# Patient Record
Sex: Female | Born: 1985 | Race: White | Hispanic: No | Marital: Married | Smoking: Current every day smoker
Health system: Southern US, Community
[De-identification: ages and names within clinical notes are randomized; demographics above are authoritative.]

## PROBLEM LIST (undated history)

## (undated) ENCOUNTER — Inpatient Hospital Stay (HOSPITAL_COMMUNITY): Payer: Self-pay

## (undated) ENCOUNTER — Inpatient Hospital Stay (HOSPITAL_COMMUNITY): Admission: RE | Payer: Self-pay | Source: Ambulatory Visit

## (undated) DIAGNOSIS — F1910 Other psychoactive substance abuse, uncomplicated: Secondary | ICD-10-CM

## (undated) DIAGNOSIS — F419 Anxiety disorder, unspecified: Secondary | ICD-10-CM

## (undated) DIAGNOSIS — F99 Mental disorder, not otherwise specified: Secondary | ICD-10-CM

## (undated) DIAGNOSIS — F32A Depression, unspecified: Secondary | ICD-10-CM

## (undated) DIAGNOSIS — Z789 Other specified health status: Secondary | ICD-10-CM

## (undated) DIAGNOSIS — F329 Major depressive disorder, single episode, unspecified: Secondary | ICD-10-CM

## (undated) HISTORY — PX: NO PAST SURGERIES: SHX2092

---

## 2012-07-17 ENCOUNTER — Emergency Department (HOSPITAL_COMMUNITY)
Admission: EM | Admit: 2012-07-17 | Discharge: 2012-07-17 | Disposition: A | Discharge status: Home / Self Care | Payer: Self-pay | Attending: Emergency Medicine | Admitting: Emergency Medicine

## 2012-07-17 ENCOUNTER — Encounter (HOSPITAL_COMMUNITY): Payer: Self-pay | Admitting: Emergency Medicine

## 2012-07-17 DIAGNOSIS — F1210 Cannabis abuse, uncomplicated: Secondary | ICD-10-CM | POA: Insufficient documentation

## 2012-07-17 DIAGNOSIS — N760 Acute vaginitis: Secondary | ICD-10-CM | POA: Insufficient documentation

## 2012-07-17 DIAGNOSIS — B9689 Other specified bacterial agents as the cause of diseases classified elsewhere: Secondary | ICD-10-CM

## 2012-07-17 DIAGNOSIS — Z3201 Encounter for pregnancy test, result positive: Secondary | ICD-10-CM | POA: Insufficient documentation

## 2012-07-17 DIAGNOSIS — R109 Unspecified abdominal pain: Secondary | ICD-10-CM | POA: Insufficient documentation

## 2012-07-17 DIAGNOSIS — F17200 Nicotine dependence, unspecified, uncomplicated: Secondary | ICD-10-CM | POA: Insufficient documentation

## 2012-07-17 DIAGNOSIS — O2390 Unspecified genitourinary tract infection in pregnancy, unspecified trimester: Secondary | ICD-10-CM | POA: Insufficient documentation

## 2012-07-17 DIAGNOSIS — R112 Nausea with vomiting, unspecified: Secondary | ICD-10-CM | POA: Insufficient documentation

## 2012-07-17 DIAGNOSIS — N390 Urinary tract infection, site not specified: Secondary | ICD-10-CM | POA: Insufficient documentation

## 2012-07-17 DIAGNOSIS — Z3490 Encounter for supervision of normal pregnancy, unspecified, unspecified trimester: Secondary | ICD-10-CM

## 2012-07-17 LAB — URINALYSIS, ROUTINE W REFLEX MICROSCOPIC
Bilirubin Urine: NEGATIVE
Ketones, ur: 15 mg/dL — AB
Nitrite: POSITIVE — AB
Protein, ur: NEGATIVE mg/dL
pH: 6 (ref 5.0–8.0)

## 2012-07-17 LAB — CBC WITH DIFFERENTIAL/PLATELET
Basophils Absolute: 0 10*3/uL (ref 0.0–0.1)
Basophils Relative: 0 % (ref 0–1)
Eosinophils Absolute: 0.3 10*3/uL (ref 0.0–0.7)
Hemoglobin: 13.9 g/dL (ref 12.0–15.0)
MCH: 31.1 pg (ref 26.0–34.0)
MCHC: 34.8 g/dL (ref 30.0–36.0)
Monocytes Absolute: 0.6 10*3/uL (ref 0.1–1.0)
Monocytes Relative: 8 % (ref 3–12)
Neutro Abs: 5.7 10*3/uL (ref 1.7–7.7)
Neutrophils Relative %: 72 % (ref 43–77)
RDW: 14.3 % (ref 11.5–15.5)

## 2012-07-17 LAB — WET PREP, GENITAL

## 2012-07-17 LAB — BASIC METABOLIC PANEL
BUN: 10 mg/dL (ref 6–23)
Creatinine, Ser: 0.66 mg/dL (ref 0.50–1.10)
GFR calc Af Amer: >90 mL/min (ref >90)
GFR calc non Af Amer: >90 mL/min (ref >90)
Potassium: 3.7 mEq/L (ref 3.5–5.1)

## 2012-07-17 LAB — HCG, QUANTITATIVE, PREGNANCY: hCG, Beta Chain, Quant, S: 40982 m[IU]/mL — ABNORMAL HIGH (ref <5)

## 2012-07-17 MED ORDER — METRONIDAZOLE 500 MG PO TABS: 500.0000 mg | ORAL_TABLET | Freq: Two times a day (BID) | ORAL | Status: DC

## 2012-07-17 MED ORDER — CONCEPT OB 130-92.4-1 MG PO CAPS: 1.0000 | ORAL_CAPSULE | Freq: Every day | ORAL | Status: DC

## 2012-07-17 MED ORDER — NITROFURANTOIN MONOHYD MACRO 100 MG PO CAPS: 100.0000 mg | ORAL_CAPSULE | Freq: Two times a day (BID) | ORAL | Status: DC

## 2012-07-17 NOTE — ED Provider Notes (Signed)
History     CSN: [id number]  Arrival date & time 07/17/12  1421   First MD Initiated Contact with Patient 07/17/12 1505      Chief Complaint  Patient presents with  . Abdominal Pain    (Consider location/radiation/quality/duration/timing/severity/associated sxs/prior treatment) The history is provided by the patient and medical records.    Gabrielle Baker is a 26 y.o. female  with a hx of multiple miscarriages presents to the Emergency Department complaining of gradual, persistent, progressively worsening lower abdominal cramping onset 9am this morning.  Pt states pain is waxing and waning but never resolving.  Denies vaginal bleeding, mild clear vaginal discharge.  Pt states she is pregnant, but does not know how far along as she does not know her LMP.  She has irregular periods and has been on the [place] Shot, last one in Sept.  G5P1030.  Pt with smoking Hx of cigarettes and mariajuana.  Associated symptoms include nausea, vomiting.  Nothing makes it better and nothing makes it worse.  Pt denies fever, chills, chest pain, shortness of breathing, diarrhea, constipation, dizziness, weakness, lightheadedness, dysuria, hematuria.  Pt initially denies ectopic pregnancy, but later states her last miscarriage was "in my tubes but went away on its own."   History reviewed. No pertinent past medical history.  History reviewed. No pertinent past surgical history.  History reviewed. No pertinent family history.  History  Substance Use Topics  . Smoking status: Current Every Day Smoker  . Smokeless tobacco: Not on file  . Alcohol Use: No    OB History    Grav Para Term Preterm Abortions TAB SAB Ect Mult Living                  Review of Systems  Constitutional: Negative for fever, diaphoresis, appetite change, fatigue and unexpected weight change.  HENT: Negative for mouth sores and neck stiffness.   Eyes: Negative for visual disturbance.  Respiratory: Negative for cough, chest  tightness, shortness of breath and wheezing.   Cardiovascular: Negative for chest pain.  Gastrointestinal: Positive for nausea, vomiting and abdominal pain. Negative for diarrhea and constipation.  Genitourinary: Negative for dysuria, urgency, frequency and hematuria.  Musculoskeletal: Negative for back pain.  Skin: Negative for rash.  Neurological: Negative for syncope, light-headedness and headaches.  Psychiatric/Behavioral: Negative for sleep disturbance. The patient is not nervous/anxious.   All other systems reviewed and are negative.    Allergies  Review of patient's allergies indicates no known allergies.  Home Medications   Current Outpatient Rx  Name  Route  Sig  Dispense  Refill  . METRONIDAZOLE 500 MG PO TABS   Oral   Take 1 tablet (500 mg total) by mouth 2 (two) times daily.   14 tablet   0   . NITROFURANTOIN MONOHYD MACRO 100 MG PO CAPS   Oral   Take 1 capsule (100 mg total) by mouth 2 (two) times daily.   10 capsule   0   . CONCEPT OB 130-92.4-1 MG PO CAPS   Oral   Take 1 capsule by mouth daily.   30 capsule   12     BP 125/76  Pulse 69  Temp 98.7 F (37.1 C) (Oral)  Resp 16  SpO2 95%  Physical Exam  Nursing note and vitals reviewed. Constitutional: He is oriented to person, place, and time. He appears well-developed and well-nourished.  HENT:  Head: Normocephalic and atraumatic.  Mouth/Throat: Oropharynx is clear and moist.  Eyes: Conjunctivae normal are normal.  Pupils are equal, round, and reactive to light. No scleral icterus.  Neck: Normal range of motion.  Cardiovascular: Normal rate, regular rhythm, normal heart sounds and intact distal pulses.  Exam reveals no gallop and no friction rub.   No murmur heard. Pulmonary/Chest: Effort normal and breath sounds normal. No respiratory distress. He has no wheezes. He has no rales.  Abdominal: Soft. Normal appearance and bowel sounds are normal. She exhibits no distension, no pulsatile midline mass  and no mass. There is tenderness (extremely mild) in the suprapubic area. There is no rigidity, no rebound, no guarding, no CVA tenderness, no tenderness at McBurney's point and negative Murphy's sign. Hernia confirmed negative in the right inguinal area and confirmed negative in the left inguinal area.  Genitourinary: No labial fusion. There is no rash, tenderness, lesion or injury on the right labia. There is no rash, tenderness, lesion or injury on the left labia. Cervix exhibits no motion tenderness, no discharge and no friability. Right adnexum displays no mass, no tenderness and no fullness. Left adnexum displays no mass, no tenderness and no fullness. No erythema, tenderness or bleeding around the vagina. No foreign body around the vagina. No signs of injury around the vagina. Vaginal discharge (milky, thick) found.       Cervical Os closed, no vaginal bleeding or bleeding from the cervix  Musculoskeletal: Normal range of motion. He exhibits no edema and no tenderness.  Lymphadenopathy:    She has no cervical adenopathy.       Right: No inguinal adenopathy present.       Left: No inguinal adenopathy present.  Neurological: He is alert and oriented to person, place, and time. He exhibits normal muscle tone. Coordination normal.  Skin: Skin is warm and dry. No rash noted. No erythema.  Psychiatric: He has a normal mood and affect.    ED Course  Procedures (including critical care time)  Labs Reviewed  URINALYSIS, ROUTINE W REFLEX MICROSCOPIC - Abnormal; Notable for the following:    Color, Urine AMBER (*)  BIOCHEMICALS MAY BE AFFECTED BY COLOR   APPearance TURBID (*)     Ketones, ur 15 (*)     Nitrite POSITIVE (*)     Leukocytes, UA LARGE (*)     All other components within normal limits  BASIC METABOLIC PANEL - Abnormal; Notable for the following:    Sodium 133 (*)     All other components within normal limits  HCG, QUANTITATIVE, PREGNANCY - Abnormal; Notable for the following:     hCG, Beta Chain, Quant, [place] [address] (*)     All other components within normal limits  POCT PREGNANCY, URINE - Abnormal; Notable for the following:    Preg Test, Ur POSITIVE (*)     All other components within normal limits  URINE MICROSCOPIC-ADD ON - Abnormal; Notable for the following:    Squamous Epithelial / LPF MANY (*)     Bacteria, UA MANY (*)     All other components within normal limits  WET PREP, GENITAL - Abnormal; Notable for the following:    Clue Cells Wet Prep HPF POC FEW (*)     WBC, Wet Prep HPF POC FEW (*)     All other components within normal limits  CBC WITH DIFFERENTIAL  URINE CULTURE  GC/CHLAMYDIA PROBE AMP   No results found.  Results for orders placed during the hospital encounter of 07/17/12  URINALYSIS, ROUTINE W REFLEX MICROSCOPIC      Component Value Range  Color, Urine AMBER (*) YELLOW   APPearance TURBID (*) CLEAR   Specific Gravity, Urine 1.027  1.005 - 1.030   pH 6.0  5.0 - 8.0   Glucose, UA NEGATIVE  NEGATIVE mg/dL   Hgb urine dipstick NEGATIVE  NEGATIVE   Bilirubin Urine NEGATIVE  NEGATIVE   Ketones, ur 15 (*) NEGATIVE mg/dL   Protein, ur NEGATIVE  NEGATIVE mg/dL   Urobilinogen, UA 1.0  0.0 - 1.0 mg/dL   Nitrite POSITIVE (*) NEGATIVE   Leukocytes, UA LARGE (*) NEGATIVE  CBC WITH DIFFERENTIAL      Component Value Range   WBC 7.9  4.0 - 10.5 K/uL   RBC 4.47  3.87 - 5.11 MIL/uL   Hemoglobin 13.9  12.0 - 15.0 g/dL   HCT 40.9  81.1 - 91.4 %   MCV 89.5  78.0 - 100.0 fL   MCH 31.1  26.0 - 34.0 pg   MCHC 34.8  30.0 - 36.0 g/dL   RDW 78.2  95.6 - 21.3 %   Platelets 227  150 - 400 K/uL   Neutrophils Relative 72  43 - 77 %   Neutro Abs 5.7  1.7 - 7.7 K/uL   Lymphocytes Relative 17  12 - 46 %   Lymphs Abs 1.3  0.7 - 4.0 K/uL   Monocytes Relative 8  3 - 12 %   Monocytes Absolute 0.6  0.1 - 1.0 K/uL   Eosinophils Relative 3  0 - 5 %   Eosinophils Absolute 0.3  0.0 - 0.7 K/uL   Basophils Relative 0  0 - 1 %   Basophils Absolute 0.0  0.0 - 0.1  K/uL  BASIC METABOLIC PANEL      Component Value Range   Sodium 133 (*) 135 - 145 mEq/L   Potassium 3.7  3.5 - 5.1 mEq/L   Chloride 98  96 - 112 mEq/L   CO2 22  19 - 32 mEq/L   Glucose, Bld 92  70 - 99 mg/dL   BUN 10  6 - 23 mg/dL   Creatinine, Ser 0.86  0.50 - 1.10 mg/dL   Calcium 9.4  8.4 - 57.8 mg/dL   GFR calc non Af Amer >90  >90 mL/min   GFR calc Af Amer >90  >90 mL/min  HCG, QUANTITATIVE, PREGNANCY      Component Value Range   hCG, Beta Chain, Quant, S [address] (*) <5 mIU/mL  POCT PREGNANCY, URINE      Component Value Range   Preg Test, Ur POSITIVE (*) NEGATIVE  URINE MICROSCOPIC-ADD ON      Component Value Range   Squamous Epithelial / LPF MANY (*) RARE   WBC, UA TOO NUMEROUS TO COUNT  <3 WBC/hpf   RBC / HPF 0-2  <3 RBC/hpf   Bacteria, UA MANY (*) RARE  WET PREP, GENITAL      Component Value Range   Yeast Wet Prep HPF POC NONE SEEN  NONE SEEN   Trich, Wet Prep NONE SEEN  NONE SEEN   Clue Cells Wet Prep HPF POC FEW (*) NONE SEEN   WBC, Wet Prep HPF POC FEW (*) NONE SEEN   No results found.  1. UTI (lower urinary tract infection)   2. Pregnancy   3. BV (bacterial vaginosis)       MDM  Gabrielle Baker presents with unknown length of pregnancy and lower abdominal pain.  Pt with bacteria, too numerous to count white blood cells positive nitrites and large leukocytes on her  urinalysis indicative of a urinary tract infection, patient also with a few clue cells and white blood cells on wet prep. HCT 40,000 according to likely partially [redacted] week gestation.  Other labs are unremarkable.  Pt initially demanding a [place] to confirm IUP, but now refusing her ultrasound stating that she no longer wants to wait. Patient states she does not want to stay because this is "a shitty hospital."   Suggested that she followup with OB/GYN and that further problems with this pregnancy be addressed by the [facility] hospital.  Will treat UTI and BV.  I have also discussed reasons to return immediately to  the ER.  Patient expresses understanding and agrees with plan.  1. Medications: flagyl, macrobid, prenatal vitamins, usual home medications 2. Treatment: rest, drink plenty of fluids, take medications as prescribed 3. Follow Up: Please followup with your primary doctor for discussion of your diagnoses and further evaluation after today's visit; if you do not have a primary care doctor use the resource guide provided to find one; followup with OB/GYN for further evaluation of your pregnancy      Dierdre Forth, PA-C 07/18/12 0030

## 2012-07-17 NOTE — ED Notes (Signed)
Pt now refusing the [place]. Initially pt wanted [place] but after talking with family reports no longer wants [place] and will just go to [facility]. Pt denies any vaginal bleeding or abdominal pain at this time.

## 2012-07-17 NOTE — ED Notes (Signed)
Pt c/o lower abd pain that is cramping in nature starting this am; pt sts she is pregnant but unsure how far along; pt sts unknown LMP; pt G5 P1 M3

## 2012-07-18 NOTE — ED Provider Notes (Signed)
Medical screening examination/treatment/procedure(s) were performed by non-physician practitioner and as supervising physician I was immediately available for consultation/collaboration.  Juliet Rude. Rubin Payor, MD 07/18/12 [phone]

## 2012-07-19 LAB — URINE CULTURE: Colony Count: >=100000

## 2012-07-20 NOTE — ED Notes (Signed)
+   Urine] Patient treated with Macrobid-sensitive to same-chart appended per protocol MD. 

## 2012-07-29 ENCOUNTER — Inpatient Hospital Stay (HOSPITAL_COMMUNITY): Payer: Self-pay

## 2012-07-29 ENCOUNTER — Inpatient Hospital Stay (HOSPITAL_COMMUNITY)
Admission: AD | Admit: 2012-07-29 | Discharge: 2012-07-29 | Disposition: A | Discharge status: Home / Self Care | Payer: Self-pay | Source: Ambulatory Visit | Attending: Obstetrics & Gynecology | Admitting: Obstetrics & Gynecology

## 2012-07-29 ENCOUNTER — Encounter (HOSPITAL_COMMUNITY): Payer: Self-pay | Admitting: *Deleted

## 2012-07-29 DIAGNOSIS — F1410 Cocaine abuse, uncomplicated: Secondary | ICD-10-CM | POA: Diagnosis present

## 2012-07-29 DIAGNOSIS — R1084 Generalized abdominal pain: Secondary | ICD-10-CM

## 2012-07-29 DIAGNOSIS — R109 Unspecified abdominal pain: Secondary | ICD-10-CM | POA: Insufficient documentation

## 2012-07-29 DIAGNOSIS — O99320 Drug use complicating pregnancy, unspecified trimester: Secondary | ICD-10-CM | POA: Diagnosis present

## 2012-07-29 DIAGNOSIS — O99891 Other specified diseases and conditions complicating pregnancy: Secondary | ICD-10-CM | POA: Insufficient documentation

## 2012-07-29 DIAGNOSIS — O26859 Spotting complicating pregnancy, unspecified trimester: Secondary | ICD-10-CM

## 2012-07-29 HISTORY — DX: Other specified health status: Z78.9

## 2012-07-29 LAB — URINALYSIS, ROUTINE W REFLEX MICROSCOPIC
Bilirubin Urine: NEGATIVE
Nitrite: NEGATIVE
Specific Gravity, Urine: 1.02 (ref 1.005–1.030)
Urobilinogen, UA: 0.2 mg/dL (ref 0.0–1.0)

## 2012-07-29 LAB — RAPID URINE DRUG SCREEN, HOSP PERFORMED
Barbiturates: NOT DETECTED
Cocaine: POSITIVE — AB
Tetrahydrocannabinol: POSITIVE — AB

## 2012-07-29 LAB — URINE MICROSCOPIC-ADD ON

## 2012-07-29 LAB — HCG, QUANTITATIVE, PREGNANCY: hCG, Beta Chain, Quant, S: 32691 m[IU]/mL — ABNORMAL HIGH (ref <5)

## 2012-07-29 NOTE — MAU Provider Note (Signed)
Chief Complaint: Abdominal Pain  First Provider Initiated Contact with Patient 07/29/12 0140     SUBJECTIVE HPI: Gabrielle Baker is a 26 y.o. G2P1001 at unknown gestation who presents to MAUeporting abd pain and spotting after IC tonight. Seen in [facility] ED 07/17/12. Pos UPT and quant 40,000. No US done to due pt leaving. Denies passing clots or tissue. Pain minimal now. Also Dx. UTI at ED visit. Took Macrobid as directed, but thinks she still has a UTI.    Past Medical History  Diagnosis Date  . No pertinent past medical history    OB History    Grav Para Term Preterm Abortions TAB SAB Ect Mult Living   2 1 1       1      # Outc Date GA Lbr Len/2nd Wgt Sex Del Anes PTL Lv   1 TRM         Yes   2 CUR              Past Surgical History  Procedure Date  . No past surgeries    History   Social History  . Marital Status: Single    Spouse Name: N/A    Number of Children: N/A  . Years of Education: N/A   Occupational History  . Not on file.   Social History Main Topics  . Smoking status: Current Every Day Smoker -- 1.0 packs/day    Types: Cigarettes  . Smokeless tobacco: Not on file  . Alcohol Use: No  . Drug Use: Yes    Special: Marijuana  . Sexually Active: Yes    Birth Control/ Protection: None   Other Topics Concern  . Not on file   Social History Narrative  . No narrative on file   No current facility-administered medications on file prior to encounter.   Current Outpatient Prescriptions on File Prior to Encounter  Medication Sig Dispense Refill  . metroNIDAZOLE (FLAGYL) 500 MG tablet Take 1 tablet (500 mg total) by mouth 2 (two) times daily.  14 tablet  0  . nitrofurantoin, macrocrystal-monohydrate, (MACROBID) 100 MG capsule Take 1 capsule (100 mg total) by mouth 2 (two) times daily.  10 capsule  0  . Prenat w/o A Vit-FeFum-FePo-FA (CONCEPT OB) 130-92.4-1 MG CAPS Take 1 capsule by mouth daily.  30 capsule  12   No Known Allergies  ROS: Pertinent items in  HPI  OBJECTIVE Blood pressure 144/113, pulse 121, temperature 98.1 F (36.7 C), temperature source Oral, resp. rate 18, height 5\' 3"  (1.6 m), weight 71.668 kg (158 lb), SpO2 100.00%. Patient Vitals for the past 24 hrs:  BP Temp Temp src Pulse Resp SpO2 Height Weight  07/29/12 0236 134/82 mmHg - - 88  20  - - -  07/29/12 0231 144/113 mmHg 98.1 F (36.7 C) Oral 121  18  - - -  07/29/12 0057 133/88 mmHg 98 F (36.7 C) Oral 74  18  100 % 5\' 3"  (1.6 m) 71.668 kg (158 lb)    GENERAL: Well-developed, well-nourished female in no acute distress, but restless. Moving constantly.  HEENT: Normocephalic HEART: normal rate RESP: normal effort ABDOMEN: Soft, non-tender EXTREMITIES: Nontender, no edema NEURO: Alert and oriented SPECULUM EXAM: deferred due to recent exam at [facility]. Neg GC/CT. No blood on pad.  LAB RESULTS Results for orders placed during the hospital encounter of 07/29/12 (from the past 24 hour(s))  URINALYSIS, ROUTINE W REFLEX MICROSCOPIC     Status: Abnormal   Collection Time  07/29/12  1:00 AM      Component Value Range   Color, Urine YELLOW  YELLOW   APPearance CLEAR  CLEAR   Specific Gravity, Urine 1.020  1.005 - 1.030   pH 6.0  5.0 - 8.0   Glucose, UA NEGATIVE  NEGATIVE mg/dL   Hgb urine dipstick TRACE (*) NEGATIVE   Bilirubin Urine NEGATIVE  NEGATIVE   Ketones, ur NEGATIVE  NEGATIVE mg/dL   Protein, ur NEGATIVE  NEGATIVE mg/dL   Urobilinogen, UA 0.2  0.0 - 1.0 mg/dL   Nitrite NEGATIVE  NEGATIVE   Leukocytes, UA SMALL (*) NEGATIVE  URINE MICROSCOPIC-ADD ON     Status: Abnormal   Collection Time   07/29/12  1:00 AM      Component Value Range   Squamous Epithelial / LPF RARE  RARE   WBC, UA 7-10  <3 WBC/hpf   Bacteria, UA FEW (*) RARE  HCG, QUANTITATIVE, PREGNANCY     Status: Abnormal   Collection Time   07/29/12  1:43 AM      Component Value Range   hCG, Beta Chain, Quant, S [address] (*) <5 mIU/mL    IMAGING US Ob Comp Less 14 Wks  07/29/2012  *RADIOLOGY  REPORT*  Clinical Data: Abdominal pain.  OBSTETRIC <14 WK ULTRASOUND  Technique:  Transabdominal ultrasound was performed for evaluation of the gestation as well as the maternal uterus and adnexal regions.  Comparison:  None.  Intrauterine gestational sac: Visualized/normal in shape. Yolk sac: No Embryo: Yes Cardiac Activity: Yes Heart Rate: 161 bpm  CRL:  6.21 cm  12 w  5 d            [place] EDC: 02/05/2013  Maternal uterus/Adnexae: No subchorionic hemorrhage is noted.  The uterus is otherwise unremarkable in appearance.  The ovaries are within normal limits.  The right ovary measures 3.4 x 1.7 x 1.4 cm, while the left ovary measures 3.0 x 1.2 x 1.5 cm. No suspicious adnexal masses are seen.  There is no evidence for ovarian torsion.  No free fluid is seen within the pelvic cul-de-sac.  IMPRESSION: Single live intrauterine pregnancy noted, with a crown-rump length of 6.2 cm, corresponding to a gestational age of [redacted] weeks 5 days. This reflects an estimated date of delivery of February 05, 2013.   Original Report Authenticated By: Tonia Ghent, M.D.     MAU COURSE  ASSESSMENT 1. Abdominal pain complicating pregnancy, antepartum    PLAN Discharge home Urine culture pending Pelvic rest x 1 week. UDS pending.     Follow-up Information    Follow up with start prenatal care.      Follow up with [facility] [place] [facility]. (As needed if symptoms worsen)    Contact information:   [address] [id number] mc [place] [address] [phone]          Medication List     As of 07/29/2012  2:45 AM    TAKE these medications         CONCEPT OB 130-92.4-1 MG Caps   Take 1 capsule by mouth daily.      metroNIDAZOLE 500 MG tablet   Commonly known as: FLAGYL   Take 1 tablet (500 mg total) by mouth 2 (two) times daily.      nitrofurantoin (macrocrystal-monohydrate) 100 MG capsule   Commonly known as: MACROBID   Take 1 capsule (100 mg total) by mouth 2  (two) times daily.  [place], CNM 07/29/2012  2:45 AM

## 2012-07-29 NOTE — MAU Note (Signed)
Pt reports she knows she is pregnant because she was seen at [facility] ED on December 15th and found out she was pregnant but she doesn't know how far along she is because "they did not do an ultrasound" , states after sex tonight she had pain and a little blood on the tissue

## 2012-07-29 NOTE — MAU Note (Signed)
Pt reports she had positive preg test 3 weeks ago, seen at [facility] ER 1 week ago and they heard a heartbeal. Thinks she is having a miscarriage because she started having pain tonight after intercourse.

## 2012-07-29 NOTE — MAU Provider Note (Signed)
Attestation of Attending Supervision of Advanced Practitioner (CNM/NP): Evaluation and management procedures were performed by the Advanced Practitioner under my supervision and collaboration.  I have reviewed the Advanced Practitioner's note and chart, and I agree with the management and plan.  HARRAWAY-SMITH, Mallery Harshman 6:49 AM     

## 2012-07-30 LAB — URINE CULTURE

## 2012-11-20 LAB — OB RESULTS CONSOLE RPR: RPR: NONREACTIVE

## 2012-11-20 LAB — OB RESULTS CONSOLE HEPATITIS B SURFACE ANTIGEN: Hepatitis B Surface Ag: NEGATIVE

## 2012-11-20 LAB — OB RESULTS CONSOLE ABO/RH: RH Type: POSITIVE

## 2012-11-20 LAB — OB RESULTS CONSOLE GC/CHLAMYDIA
Chlamydia: NEGATIVE
GC PROBE AMP, GENITAL: NEGATIVE

## 2012-11-20 LAB — OB RESULTS CONSOLE RUBELLA ANTIBODY, IGM: RUBELLA: IMMUNE

## 2012-11-20 LAB — OB RESULTS CONSOLE GBS: GBS: POSITIVE

## 2012-11-20 LAB — OB RESULTS CONSOLE ANTIBODY SCREEN: ANTIBODY SCREEN: NEGATIVE

## 2013-01-17 ENCOUNTER — Encounter (HOSPITAL_COMMUNITY): Payer: Self-pay | Admitting: Emergency Medicine

## 2013-01-17 ENCOUNTER — Emergency Department (HOSPITAL_COMMUNITY)
Admission: EM | Admit: 2013-01-17 | Discharge: 2013-01-17 | Disposition: A | Discharge status: Home / Self Care | Payer: Self-pay | Attending: Emergency Medicine | Admitting: Emergency Medicine

## 2013-01-17 DIAGNOSIS — H109 Unspecified conjunctivitis: Secondary | ICD-10-CM | POA: Insufficient documentation

## 2013-01-17 DIAGNOSIS — F17200 Nicotine dependence, unspecified, uncomplicated: Secondary | ICD-10-CM | POA: Insufficient documentation

## 2013-01-17 DIAGNOSIS — IMO0002 Reserved for concepts with insufficient information to code with codable children: Secondary | ICD-10-CM | POA: Insufficient documentation

## 2013-01-17 DIAGNOSIS — R599 Enlarged lymph nodes, unspecified: Secondary | ICD-10-CM | POA: Insufficient documentation

## 2013-01-17 MED ORDER — FLUORESCEIN SODIUM 1 MG OP STRP
1.0000 | ORAL_STRIP | Freq: Once | OPHTHALMIC | Status: DC
  Filled 2013-01-17: qty 1

## 2013-01-17 MED ORDER — TETRACAINE HCL 0.5 % OP SOLN
1.0000 [drp] | Freq: Once | OPHTHALMIC | Status: AC
  Administered 2013-01-17: 1 [drp] via OPHTHALMIC
  Filled 2013-01-17: qty 2

## 2013-01-17 MED ORDER — CEPHALEXIN 250 MG PO CAPS
500.0000 mg | ORAL_CAPSULE | Freq: Once | ORAL | Status: AC
  Administered 2013-01-17: 500 mg via ORAL
  Filled 2013-01-17: qty 2

## 2013-01-17 MED ORDER — ERYTHROMYCIN 5 MG/GM OP OINT
1.0000 "application " | TOPICAL_OINTMENT | Freq: Once | OPHTHALMIC | Status: AC
  Administered 2013-01-17: 1 via OPHTHALMIC
  Filled 2013-01-17: qty 1

## 2013-01-17 MED ORDER — CEPHALEXIN 500 MG PO CAPS: 500.0000 mg | ORAL_CAPSULE | Freq: Four times a day (QID) | ORAL | Status: DC

## 2013-01-17 MED ORDER — FLUORESCEIN SODIUM 1 MG OP STRP: 1.0000 | ORAL_STRIP | Freq: Once | OPHTHALMIC | Status: DC

## 2013-01-17 MED ORDER — IBUPROFEN 400 MG PO TABS
400.0000 mg | ORAL_TABLET | Freq: Once | ORAL | Status: AC
  Administered 2013-01-17: 400 mg via ORAL
  Filled 2013-01-17: qty 1

## 2013-01-17 NOTE — ED Notes (Signed)
Started 2 days ago with right eye pain and irritation. No known injury. States has gotten worse over past 2 days. Right eye red and lid swollen.

## 2013-01-17 NOTE — ED Provider Notes (Signed)
History     CSN: [id number]  Arrival date & time 01/17/13  1434   First MD Initiated Contact with Patient 01/17/13 1453      No chief complaint on file.   (Consider location/radiation/quality/duration/timing/severity/associated sxs/prior treatment) HPI  Gabrielle Baker is a 27 y.o. female complaining of complaining of right eye irritation (foreign body sensation) erythema, clear discharge worsening over the course of last 2 days. Patient denies trauma, fever, diplopia, pain with eye movement.    Past Medical History  Diagnosis Date  . No pertinent past medical history     Past Surgical History  Procedure Laterality Date  . No past surgeries      No family history on file.  History  Substance Use Topics  . Smoking status: Current Every Day Smoker -- 1.00 packs/day    Types: Cigarettes  . Smokeless tobacco: Not on file  . Alcohol Use: No    OB History   Grav Para Term Preterm Abortions TAB SAB Ect Mult Living   2 1 1       1       Review of Systems  Constitutional:       Negative except as described in HPI  HENT:       Negative except as described in HPI  Respiratory:       Negative except as described in HPI  Cardiovascular:       Negative except as described in HPI  Gastrointestinal:       Negative except as described in HPI  Genitourinary:       Negative except as described in HPI  Musculoskeletal:       Negative except as described in HPI  Skin:       Negative except as described in HPI  Neurological:       Negative except as described in HPI  All other systems reviewed and are negative.    Allergies  Review of patient's allergies indicates no known allergies.  Home Medications   Current Outpatient Rx  Name  Route  Sig  Dispense  Refill  . metroNIDAZOLE (FLAGYL) 500 MG tablet   Oral   Take 1 tablet (500 mg total) by mouth 2 (two) times daily.   14 tablet   0   . nitrofurantoin, macrocrystal-monohydrate, (MACROBID) 100 MG capsule    Oral   Take 1 capsule (100 mg total) by mouth 2 (two) times daily.   10 capsule   0   . Prenat w/o A Vit-FeFum-FePo-FA (CONCEPT OB) 130-92.4-1 MG CAPS   Oral   Take 1 capsule by mouth daily.   30 capsule   12     BP 135/88  Pulse 102  Temp(Src) 98 F (36.7 C) (Oral)  SpO2 98%  Physical Exam  Nursing note and vitals reviewed. Constitutional: She is oriented to person, place, and time. She appears well-developed and well-nourished. No distress.  HENT:  Head: Normocephalic and atraumatic.  Mouth/Throat: Oropharynx is clear and moist.  Eyes: EOM are normal. Pupils are equal, round, and reactive to light. Right eye exhibits discharge.  Bulbar conjunctiva or erythema to the right thigh. Right upper lid is mildly swollen and erythematous, there is no proptosis, pupils are equal round and reactive to light. Extraocular movement is intact with no pain at upward gaze or diplopia.  Neck:  Shotty anterior cervical lymphadenopathy,  Cardiovascular: Normal rate.   Pulmonary/Chest: Effort normal and breath sounds normal. No stridor. No respiratory distress. She has no wheezes. She has  no rales. She exhibits no tenderness.  Abdominal: Soft. Bowel sounds are normal. She exhibits no distension and no mass. There is no tenderness. There is no rebound and no guarding.  Musculoskeletal: Normal range of motion.  Lymphadenopathy:    She has cervical adenopathy.  Neurological: She is alert and oriented to person, place, and time.  Psychiatric:  Agitated, jumpy    ED Course  Procedures (including critical care time)  Labs Reviewed - No data to display No results found.   1. Conjunctivitis       MDM   Filed Vitals:   01/17/13 1454  BP: 135/88  Pulse: 102  Temp: 98 F (36.7 C)  TempSrc: Oral  SpO2: 98%     Gabrielle Baker is a 27 y.o. female right upper eyelid swelling and discharge. Conjunctivitis may be developing into a preseptal cellulitis. I will treat her with systemic  antibiotics. Extensive discussion of return precautions and need for ophtho follow up.   Medications  tetracaine (PONTOCAINE) 0.5 % ophthalmic solution 1 drop (not administered)  ibuprofen (ADVIL,MOTRIN) tablet 400 mg (not administered)  erythromycin ophthalmic ointment 1 application (not administered)  cephALEXin (KEFLEX) capsule 500 mg (not administered)  fluorescein ophthalmic strip 1 strip (not administered)    Pt is hemodynamically stable, appropriate for, and amenable to discharge at this time. Pt verbalized understanding and agrees with care plan. Outpatient follow-up and specific return precautions discussed.    New Prescriptions   CEPHALEXIN (KEFLEX) 500 MG CAPSULE    Take 1 capsule (500 mg total) by mouth 4 (four) times daily.          Wynetta Emery, PA-C 01/17/13 1537

## 2013-01-18 NOTE — ED Provider Notes (Signed)
Medical screening examination/treatment/procedure(s) were performed by non-physician practitioner and as supervising physician I was immediately available for consultation/collaboration.   Gleb Mcguire M Xachary Hambly, DO 01/18/13 0957 

## 2013-01-24 ENCOUNTER — Emergency Department (HOSPITAL_COMMUNITY)
Admission: EM | Admit: 2013-01-24 | Discharge: 2013-01-24 | Disposition: A | Discharge status: Home / Self Care | Payer: Self-pay | Attending: Emergency Medicine | Admitting: Emergency Medicine

## 2013-01-24 ENCOUNTER — Encounter (HOSPITAL_COMMUNITY): Payer: Self-pay | Admitting: Emergency Medicine

## 2013-01-24 DIAGNOSIS — H11429 Conjunctival edema, unspecified eye: Secondary | ICD-10-CM | POA: Insufficient documentation

## 2013-01-24 DIAGNOSIS — H5789 Other specified disorders of eye and adnexa: Secondary | ICD-10-CM | POA: Insufficient documentation

## 2013-01-24 DIAGNOSIS — Z792 Long term (current) use of antibiotics: Secondary | ICD-10-CM | POA: Insufficient documentation

## 2013-01-24 DIAGNOSIS — L539 Erythematous condition, unspecified: Secondary | ICD-10-CM | POA: Insufficient documentation

## 2013-01-24 DIAGNOSIS — B303 Acute epidemic hemorrhagic conjunctivitis (enteroviral): Secondary | ICD-10-CM | POA: Insufficient documentation

## 2013-01-24 DIAGNOSIS — F17200 Nicotine dependence, unspecified, uncomplicated: Secondary | ICD-10-CM | POA: Insufficient documentation

## 2013-01-24 MED ORDER — SULFACETAMIDE-PREDNISOLONE 10-0.23 % OP SOLN: OPHTHALMIC | Status: DC

## 2013-01-24 NOTE — ED Provider Notes (Signed)
History    CSN: [id number] Arrival date & time 01/24/13  1212  First MD Initiated Contact with Patient 01/24/13 1230     Chief Complaint  Patient presents with  . Eye Problem   (Consider location/radiation/quality/duration/timing/severity/associated sxs/prior Treatment) HPI Comments: 27 y.o. Female with no significant PMHx presents today complaining of bilateral eye rides, watery discharge, and swelling. Pt was seen on 01/18/13 dx with bacterial conjunctivitis, placed on abx, and directed to flow up with ophthalmologist. Before history or physical exam began, pt was screaming and cursing that we "need to give her pain meds." During the hx, asked why she did not follow up with an ophthalmologist, pt stated, "What do you think I'm made of money. You just want to get rid of me. You need to fix this here because I can't afford to go to an ophthalmologist." Pt language peppered with expletives throughout. Difficult to elicit history. Pt was offered repeated reassurance that we will do as much as we can for her today, but, ultimately, she does need to see an ophthalmologist.   This is an exacerbation of an ongoing problem. Pt rates her condition as severe. Pt does state that both eyes became red and swelling increased over the last few days. Pt asking for pain meds, but does not endorse pain of eyes, eye movement, or orbits when asked. Pt denies lack of visual acuity, diplopia, photophobia, fevers, nausea, vomiting.   Past Medical History  Diagnosis Date  . No pertinent past medical history    Past Surgical History  Procedure Laterality Date  . No past surgeries     No family history on file. History  Substance Use Topics  . Smoking status: Current Every Day Smoker -- 1.00 packs/day    Types: Cigarettes  . Smokeless tobacco: Not on file  . Alcohol Use: No   OB History   Grav Para Term Preterm Abortions TAB SAB Ect Mult Living   2 1 1       1      Review of Systems  Constitutional:  Negative for fever and diaphoresis.  HENT: Negative for neck pain and neck stiffness.   Eyes: Positive for discharge and redness. Negative for pain, itching and visual disturbance.       Bilateral clear watery discharge, redness to sclera and conjunctiva, edema to upper and lower lids.   Respiratory: Negative for shortness of breath.   Cardiovascular: Negative for chest pain.  Gastrointestinal: Negative for nausea and vomiting.  Genitourinary: Negative for dysuria.  Musculoskeletal: Negative for gait problem.  Skin: Negative for rash.  Neurological: Negative for dizziness, weakness, light-headedness, numbness and headaches.    Allergies  Review of patient's allergies indicates no known allergies.  Home Medications   Current Outpatient Rx  Name  Route  Sig  Dispense  Refill  . cephALEXin (KEFLEX) 500 MG capsule   Oral   Take 1 capsule (500 mg total) by mouth 4 (four) times daily.   28 capsule   0   . ibuprofen (ADVIL,MOTRIN) 200 MG tablet   Oral   Take 400 mg by mouth every 6 (six) hours as needed for pain.         Marland Kitchen sulfacetamide-prednisoLONE (VASOCIDIN) 10-0.23 % ophthalmic solution      Place 1 drop into both eyes 3 (three) times daily for 5 days.   5 mL   0    BP 135/98  Pulse 81  Temp(Src) 98.2 F (36.8 C) (Oral)  SpO2 100%  LMP  01/22/2013  Breastfeeding? Unknown Physical Exam  Nursing note and vitals reviewed. Constitutional: She is oriented to person, place, and time. No distress.  HENT:  Head: Normocephalic and atraumatic.  Eyes: EOM are normal. Pupils are equal, round, and reactive to light. Right eye exhibits chemosis and discharge. Left eye exhibits chemosis and discharge. Right conjunctiva is injected. Left conjunctiva is injected.    Erythema and edema of bilateral conjunctiva with watery discharge, hyperemia, chemosis, and right sided preauricular lymphadenopathy. Visual acuity 20/70. Full EOM with no pain. No tenderness to palpation of orbit. Edema  of upper and lower lids.  Neck: Normal range of motion. Neck supple.  No meningeal signs  Cardiovascular: Normal rate, regular rhythm and normal heart sounds.   Pulmonary/Chest: Effort normal and breath sounds normal.  Abdominal: Soft.  Musculoskeletal: Normal range of motion. She exhibits no edema and no tenderness.  Neurological: She is alert and oriented to person, place, and time. No cranial nerve deficit.  Skin: Skin is warm and dry. She is not diaphoretic.  Psychiatric:  Combative, arguementative    ED Course  Procedures (including critical care time) Labs Reviewed - No data to display No results found. 1. Conjunctivitis, epidemic hemorrhagic     MDM  PE reveals acute follicular conjunctivitis, with watery discharge, hyperemia, chemosis, and right sided preauricular lymphadenopathy. Visual acuity 20/70. Full EOM with no pain. No tenderness to palpation of orbit. Edema of upper and lower lids. Despite repeated explanations of her condition and while serious, not emergent, pt continues to demand pain meds, states that "we need to fix this here, because she cannot afford to see an ophthalmologist" and states "this is bullshit." Consulted Dr. Rosalia Hammers a second time who verified that this is the treatment we recommend.  Again explained to the pt at the nurse's station with security present that "this is the treatment we recommend" that she should "stop the erythromycin" and that she should "see an ophthalmologist or she could lose her vision."  Discussed reasons to seek immediate care. Patient expresses understanding and begrudgingly agrees with plan.     Glade Nurse, PA-C 01/24/13 2142

## 2013-01-24 NOTE — ED Notes (Addendum)
Pt was here last week with eye infection. Was placed on abx. Pt returns today with both eyelids swollen, red and bilateral scleral hemorrhages. "I want an HIV test".

## 2013-01-24 NOTE — ED Notes (Signed)
Pt upset, cursing because PA will not give her the Rx here. "I can't afford it". PA informed.

## 2013-01-28 NOTE — ED Provider Notes (Signed)
  I performed a history and physical examination of Gabrielle Baker and discussed her management with [name].  I agree with the history, physical, assessment, and plan of care, with the following exceptions: None  I was present for the following procedures: None Time Spent in Critical Care of the patient: None Time spent in discussions with the patient and family: 79  27 y.o. Female with bilateral conjunctival injection c.w. Epidemic keratoconjunctivitis.  She has a right preauricular node.  It  Has been made very clear that she needs to follow up with opthalmology or risk loss of vision.  We have offered to make follow up appointment. Patient refused our efforts.   Holli Humbles, MD 01/28/13 1231

## 2013-02-12 ENCOUNTER — Emergency Department (HOSPITAL_COMMUNITY)
Admission: EM | Admit: 2013-02-12 | Discharge: 2013-02-12 | Disposition: A | Discharge status: Other Acute Inpatient Hospital | Payer: Self-pay | Attending: Emergency Medicine | Admitting: Emergency Medicine

## 2013-02-12 ENCOUNTER — Inpatient Hospital Stay (HOSPITAL_COMMUNITY)
Admission: AD | Admit: 2013-02-12 | Discharge: 2013-02-16 | DRG: 885 | Disposition: A | Discharge status: Home / Self Care | Payer: No Typology Code available for payment source | Source: Intra-hospital | Attending: Psychiatry | Admitting: Psychiatry

## 2013-02-12 ENCOUNTER — Encounter (HOSPITAL_COMMUNITY): Payer: Self-pay | Admitting: Emergency Medicine

## 2013-02-12 DIAGNOSIS — F19921 Other psychoactive substance use, unspecified with intoxication with delirium: Secondary | ICD-10-CM | POA: Insufficient documentation

## 2013-02-12 DIAGNOSIS — F329 Major depressive disorder, single episode, unspecified: Secondary | ICD-10-CM | POA: Insufficient documentation

## 2013-02-12 DIAGNOSIS — F1110 Opioid abuse, uncomplicated: Secondary | ICD-10-CM | POA: Insufficient documentation

## 2013-02-12 DIAGNOSIS — Z5900 Homelessness unspecified: Secondary | ICD-10-CM

## 2013-02-12 DIAGNOSIS — Z3202 Encounter for pregnancy test, result negative: Secondary | ICD-10-CM | POA: Insufficient documentation

## 2013-02-12 DIAGNOSIS — F17200 Nicotine dependence, unspecified, uncomplicated: Secondary | ICD-10-CM | POA: Insufficient documentation

## 2013-02-12 DIAGNOSIS — F1114 Opioid abuse with opioid-induced mood disorder: Secondary | ICD-10-CM

## 2013-02-12 DIAGNOSIS — Z79899 Other long term (current) drug therapy: Secondary | ICD-10-CM

## 2013-02-12 DIAGNOSIS — F1410 Cocaine abuse, uncomplicated: Secondary | ICD-10-CM | POA: Insufficient documentation

## 2013-02-12 DIAGNOSIS — F323 Major depressive disorder, single episode, severe with psychotic features: Principal | ICD-10-CM

## 2013-02-12 DIAGNOSIS — F1420 Cocaine dependence, uncomplicated: Secondary | ICD-10-CM | POA: Diagnosis present

## 2013-02-12 HISTORY — DX: Other psychoactive substance abuse, uncomplicated: F19.10

## 2013-02-12 HISTORY — DX: Mental disorder, not otherwise specified: F99

## 2013-02-12 HISTORY — DX: Major depressive disorder, single episode, unspecified: F32.9

## 2013-02-12 HISTORY — DX: Depression, unspecified: F32.A

## 2013-02-12 LAB — COMPREHENSIVE METABOLIC PANEL
ALT: 20 U/L (ref 0–35)
Albumin: 3.9 g/dL (ref 3.5–5.2)
Alkaline Phosphatase: 59 U/L (ref 39–117)
BUN: 17 mg/dL (ref 6–23)
Calcium: 9.2 mg/dL (ref 8.4–10.5)
GFR calc Af Amer: >90 mL/min (ref >90)
Glucose, Bld: 81 mg/dL (ref 70–99)
Potassium: 3.5 mEq/L (ref 3.5–5.1)
Sodium: 137 mEq/L (ref 135–145)
Total Protein: 6.8 g/dL (ref 6.0–8.3)

## 2013-02-12 LAB — URINE MICROSCOPIC-ADD ON

## 2013-02-12 LAB — RAPID URINE DRUG SCREEN, HOSP PERFORMED
Benzodiazepines: NOT DETECTED
Cocaine: POSITIVE — AB
Opiates: POSITIVE — AB

## 2013-02-12 LAB — CBC WITH DIFFERENTIAL/PLATELET
Basophils Relative: 0 % (ref 0–1)
Eosinophils Absolute: 0.2 10*3/uL (ref 0.0–0.7)
Eosinophils Relative: 2 % (ref 0–5)
Lymphs Abs: 1.9 10*3/uL (ref 0.7–4.0)
MCH: 31 pg (ref 26.0–34.0)
MCHC: 35.4 g/dL (ref 30.0–36.0)
MCV: 87.7 fL (ref 78.0–100.0)
Neutrophils Relative %: 69 % (ref 43–77)
Platelets: 234 10*3/uL (ref 150–400)

## 2013-02-12 LAB — URINALYSIS, ROUTINE W REFLEX MICROSCOPIC
Glucose, UA: NEGATIVE mg/dL
Nitrite: NEGATIVE
Specific Gravity, Urine: 1.034 — ABNORMAL HIGH (ref 1.005–1.030)
pH: 5 (ref 5.0–8.0)

## 2013-02-12 LAB — ETHANOL: Alcohol, Ethyl (B): <11 mg/dL (ref 0–11)

## 2013-02-12 LAB — PREGNANCY, URINE: Preg Test, Ur: NEGATIVE

## 2013-02-12 MED ORDER — ALUM & MAG HYDROXIDE-SIMETH 200-200-20 MG/5ML PO SUSP: 30.0000 mL | ORAL | Status: DC | PRN

## 2013-02-12 MED ORDER — ZIPRASIDONE MESYLATE 20 MG IM SOLR
20.0000 mg | Freq: Once | INTRAMUSCULAR | Status: DC
  Administered 2013-02-12: 20 mg via INTRAMUSCULAR
  Filled 2013-02-12: qty 20

## 2013-02-12 MED ORDER — HYDROXYZINE HCL 25 MG PO TABS
25.0000 mg | ORAL_TABLET | Freq: Four times a day (QID) | ORAL | Status: DC | PRN
  Administered 2013-02-13 (×2): 25 mg via ORAL

## 2013-02-12 MED ORDER — DICYCLOMINE HCL 20 MG PO TABS: 20.0000 mg | ORAL_TABLET | Freq: Four times a day (QID) | ORAL | Status: DC | PRN

## 2013-02-12 MED ORDER — ONDANSETRON 4 MG PO TBDP: 4.0000 mg | ORAL_TABLET | Freq: Four times a day (QID) | ORAL | Status: DC | PRN

## 2013-02-12 MED ORDER — METHOCARBAMOL 500 MG PO TABS: 500.0000 mg | ORAL_TABLET | Freq: Three times a day (TID) | ORAL | Status: DC | PRN

## 2013-02-12 MED ORDER — TRAZODONE HCL 50 MG PO TABS
50.0000 mg | ORAL_TABLET | Freq: Every evening | ORAL | Status: DC | PRN
  Administered 2013-02-13: 50 mg via ORAL
  Filled 2013-02-12: qty 1

## 2013-02-12 MED ORDER — LOPERAMIDE HCL 2 MG PO CAPS: 2.0000 mg | ORAL_CAPSULE | ORAL | Status: DC | PRN

## 2013-02-12 MED ORDER — ZIPRASIDONE MESYLATE 20 MG IM SOLR
20.0000 mg | Freq: Four times a day (QID) | INTRAMUSCULAR | Status: AC | PRN
Start: 2013-02-12 — End: 2013-02-14

## 2013-02-12 MED ORDER — NICOTINE 21 MG/24HR TD PT24
21.0000 mg | MEDICATED_PATCH | Freq: Once | TRANSDERMAL | Status: DC
  Administered 2013-02-12: 21 mg via TRANSDERMAL
  Filled 2013-02-12: qty 1

## 2013-02-12 MED ORDER — ACETAMINOPHEN 325 MG PO TABS
650.0000 mg | ORAL_TABLET | Freq: Four times a day (QID) | ORAL | Status: DC | PRN
  Administered 2013-02-13: 650 mg via ORAL

## 2013-02-12 MED ORDER — MAGNESIUM HYDROXIDE 400 MG/5ML PO SUSP: 30.0000 mL | Freq: Every day | ORAL | Status: DC | PRN

## 2013-02-12 MED ORDER — NAPROXEN 500 MG PO TABS
500.0000 mg | ORAL_TABLET | Freq: Two times a day (BID) | ORAL | Status: DC | PRN
  Administered 2013-02-13: 500 mg via ORAL
  Filled 2013-02-12: qty 1

## 2013-02-12 NOTE — ED Notes (Signed)
Pt stated she was feeling sleepy and wanted to sit down.  Pt placed in bed and pt immediately fell asleep.  Pulse ox and blood pressure cuff placed on pt.  Resps e/u.  VSS.

## 2013-02-12 NOTE — BH Assessment (Addendum)
[facility] Assessment Progress Note      02/12/13. 2000.  Telepsych recommends inpt psych hospitalization.  Telepsych note as follows: "27 year old female who was brought in by police after being found wandering disorganized, partially clothed and required Geodon IM.  She has been smoking crack cocaine.  Denies prior psych admission.  On exam she is very internally pre-occupied, speech is slow and disorganized; + AH of voice of devil telling her to kill self.  J/I are impaired.  Mood is depressed.  +SI." Daleen Squibb, LCSWA  02/12/13. 2030.  Pt accepted at [facility] Maryjean Morn to Dr Jannifer Franklin.  407-2.  All paperwork completed. Daleen Squibb, LCSWA

## 2013-02-12 NOTE — ED Notes (Signed)
IV started and blood drawn. Upon awakening pt, pt became agitated. States "have you all just been shooting me up all night". Informed patient of the care given and plan of care. Pt. Agreed to let this nurse start an IV. Pt. Tolerated IV start poorly (jerking her arm away and yelling at this nurse). IV was established and blood drawn. Pt. Immediately fell back asleep. Pt. Resting calmly at this time.

## 2013-02-12 NOTE — ED Notes (Signed)
EMS was called by a passerby regarding the patient walking in the middle of the street, patient admitted to using crack cocaine and possibly heroin, she was combative and agitated in route to the hospital

## 2013-02-12 NOTE — ED Provider Notes (Signed)
8:05 PM The patient was seen and evaluated by the psychiatrist Dr.Horwath who believes the patient is mentally unstable at this time and will need admission to a psychiatric unit.  Diagnosis: unspecified psychosis Cocaine abuse    Lyanne Co, MD 02/12/13 2006

## 2013-02-12 NOTE — ED Notes (Signed)
Telepsych session started at bedside.

## 2013-02-12 NOTE — ED Notes (Signed)
When Ava -- ACT team rep was doing assessment, pt admitted to being homeless, jobless, and boyfriendless, then became uncooperative with interview-- turning TV up load and eating peanut butter crackers, ignoring ACT team member.

## 2013-02-12 NOTE — ED Notes (Signed)
Per EDP, blood work and EKG can be delayed as pt has fallen asleep.  Resps e/u.
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
## 2013-02-12 NOTE — ED Notes (Signed)
Telepsych session complete

## 2013-02-12 NOTE — ED Notes (Signed)
Moved to room C22-- pt arousable-- will follow commands. In hospital gown, saline lock intact in right forearm, on stretcher at present.

## 2013-02-12 NOTE — BH Assessment (Signed)
Assessment Note     Patient is a 27year old white female.  Patient reports that she overdosed on drugs in an attempt to kill herself.  However, documentation in epic chart reports that the patient stated that she just used too many drugs at one time and was not suicidal.  Patient reports a past history of suicidal attempts and psychiatric hospitalizations but she was not able to report the name of the hospitals or the year in which she was hospitalized.  Patient is a poor historian.  Throughout the assessment the patient became very agitated when being asked questions and began to turn up the television so that she would not have to answer the questions.  Patient reported that her throat was sour and she was not able to talk very loud.  Patient reports that she does not know how much crack she used on a daily basis or when she first began using. Patient denied HI.  Patient denied psychosis. Patient report that she is not able to talk anymore due to her voice.  Writer consulted with Dr. Ardis Hughs regarding the patients behaviors and a Telepsych will be order for the disposition  of this patient.      Axis I: Major Depression, single episode and Substance Abuse Axis II: Deferred Axis III:  Past Medical History  Diagnosis Date  . No pertinent past medical history   . Polysubstance abuse    Axis IV: economic problems, educational problems, housing problems, occupational problems, other psychosocial or environmental problems, problems related to social environment, problems with access to health care services and problems with primary support group Axis V: 31-40 impairment in reality testing  Past Medical History:  Past Medical History  Diagnosis Date  . No pertinent past medical history   . Polysubstance abuse     Past Surgical History  Procedure Laterality Date  . No past surgeries      Family History: History reviewed. No pertinent family history.  Social History:  reports that she has  been smoking Cigarettes.  She has been smoking about 1.00 pack per day. She does not have any smokeless tobacco history on file. She reports that she uses illicit drugs (Marijuana). She reports that she does not drink alcohol.  Additional Social History:     CIWA: CIWA-Ar BP: 119/81 mmHg Pulse Rate: 58 COWS:    Allergies: No Known Allergies  Home Medications:  (Not in a hospital admission)  OB/GYN Status:  Patient's last menstrual period was 01/22/2013.  General Assessment Data Location of Assessment: [facility] ED ACT Assessment: Yes Living Arrangements: Other (Comment) Can pt return to current living arrangement?: Yes Admission Status: Voluntary Is patient capable of signing voluntary admission?: Yes Transfer from: Acute Hospital Referral Source: Self/Family/Friend  Education Status Is patient currently in school?: No  Risk to self Suicidal Ideation: Yes-Currently Present Suicidal Intent: Yes-Currently Present Is patient at risk for suicide?: Yes Suicidal Plan?: Yes-Currently Present Specify Current Suicidal Plan: overdose on drugs Access to Means: Yes Specify Access to Suicidal Means: cocaine What has been your use of drugs/alcohol within the last 12 months?: cocaine Previous Attempts/Gestures: Yes How many times?: 3 Other Self Harm Risks: None Reported Triggers for Past Attempts: None known Intentional Self Injurious Behavior: None Family Suicide History: No Recent stressful life event(s): Job Loss;Financial Problems;Conflict (Comment) (Patient reports that she broke up with her boyfriend. ) Persecutory voices/beliefs?: Yes Depression: Yes Depression Symptoms: Despondent;Tearfulness;Fatigue;Loss of interest in usual pleasures Substance abuse history and/or treatment for substance abuse?: Yes  Suicide prevention information given to non-admitted patients: Yes  Risk to Others Homicidal Ideation: No-Not Currently/Within Last 6 Months Thoughts of Harm to Others: No-Not  Currently Present/Within Last 6 Months Current Homicidal Intent: No Current Homicidal Plan: No Access to Homicidal Means: No Identified Victim: None History of harm to others?: No Assessment of Violence: On admission Violent Behavior Description: Patient was calm during the assessment. Patient had to receive Geodon after admission.  Does patient have access to weapons?: No Criminal Charges Pending?: No Does patient have a court date: No  Psychosis Hallucinations: None noted Delusions: None noted  Mental Status Report Appear/Hygiene: Disheveled Eye Contact: Poor Motor Activity: Freedom of movement Speech: Slow;Slurred;Soft;Other (Comment) (Pt reports that her throat was sour and was not able to talk) Level of Consciousness: Quiet/awake Mood: Anxious;Suspicious;Fearful Affect: Blunted Anxiety Level: Moderate Thought Processes: Coherent;Relevant Judgement: Unimpaired Orientation: Person;Place;Time;Situation Obsessive Compulsive Thoughts/Behaviors: None  Cognitive Functioning Concentration: Decreased Memory: Recent Impaired;Remote Impaired IQ: Average Insight: Poor Impulse Control: Poor Appetite: Poor Weight Loss: 0 Weight Gain: 0 Sleep: Increased Total Hours of Sleep: 9 Vegetative Symptoms: None  ADLScreening [facility] Assessment Services) Patient's cognitive ability adequate to safely complete daily activities?: Yes Patient able to express need for assistance with ADLs?: Yes Independently performs ADLs?: Yes (appropriate for developmental age)  Abuse/Neglect [facility]) Physical Abuse: Denies Verbal Abuse: Denies Sexual Abuse: Denies  Prior Inpatient Therapy Prior Inpatient Therapy: Yes Prior Therapy Dates: unable to remember Prior Therapy Facilty/Provider(s): unable to remember Reason for Treatment: unable to remember   Prior Outpatient Therapy Prior Outpatient Therapy: No Prior Therapy Dates: NA Prior Therapy Facilty/Provider(s): NA Reason for Treatment: NA  ADL  Screening (condition at time of admission) Patient's cognitive ability adequate to safely complete daily activities?: Yes Patient able to express need for assistance with ADLs?: Yes Independently performs ADLs?: Yes (appropriate for developmental age)       Abuse/Neglect Assessment (Assessment to be complete while patient is alone) Physical Abuse: Denies Verbal Abuse: Denies Sexual Abuse: Denies Values / Beliefs Cultural Requests During Hospitalization: None Spiritual Requests During Hospitalization: None        Additional Information 1:1 In Past 12 Months?: No CIRT Risk: No Elopement Risk: No Does patient have medical clearance?: Yes     Disposition: Pending Telepsych.   Disposition Initial Assessment Completed for this Encounter: Yes Disposition of Patient: Referred to Patient referred to: Other (Comment)  On Site Evaluation by:   Reviewed with Physician:     Phillip Heal LaVerne 02/12/2013 5:40 PM

## 2013-02-12 NOTE — ED Provider Notes (Addendum)
History    CSN: [id number] Arrival date & time 02/12/13  0147  First MD Initiated Contact with Patient 02/12/13 0201     Chief Complaint  Patient presents with  . Drug Overdose   level V caveat for delirium HPI Gabrielle Baker is a 27 y.o. female was brought into the emergency department for various by EMS. EMS was called by passerby, patient was walking in the street admitted to using crack cocaine, heroin and has been extremely agitated in the emergency department. Patient says "I have the devil coming out of me." Repeatedly is checking underneath the sink and stating they're saying she is "spitting out the devil."  Patient is very difficult to redirect, does not answer questions appropriately, she is protecting her airway, is clearly confused and delirious.  Past Medical History  Diagnosis Date  . No pertinent past medical history    Past Surgical History  Procedure Laterality Date  . No past surgeries     History reviewed. No pertinent family history. History  Substance Use Topics  . Smoking status: Current Every Day Smoker -- 1.00 packs/day    Types: Cigarettes  . Smokeless tobacco: Not on file  . Alcohol Use: No   OB History   Grav Para Term Preterm Abortions TAB SAB Ect Mult Living   2 1 1       1      Review of Systems Level V caveat for delirium Allergies  Review of patient's allergies indicates no known allergies.  Home Medications   Current Outpatient Rx  Name  Route  Sig  Dispense  Refill  . cephALEXin (KEFLEX) 500 MG capsule   Oral   Take 1 capsule (500 mg total) by mouth 4 (four) times daily.   28 capsule   0   . ibuprofen (ADVIL,MOTRIN) 200 MG tablet   Oral   Take 400 mg by mouth every 6 (six) hours as needed for pain.         Marland Kitchen sulfacetamide-prednisoLONE (VASOCIDIN) 10-0.23 % ophthalmic solution      Place 1 drop into both eyes 3 (three) times daily for 5 days.   5 mL   0    BP 126/71  Pulse 92  Resp 20  SpO2 99%  LMP  01/22/2013 Physical Exam  Nursing notes reviewed.  Electronic medical record reviewed. VITAL SIGNS:   Filed Vitals:   02/12/13 0300 02/12/13 0400 02/12/13 0500 02/12/13 0600  BP: 121/66 126/75 129/77 126/71  Pulse: 83 90 83 92  Resp:      SpO2: 98% 98% 98% 99%   CONSTITUTIONAL: Awake, oriented to self, appears agitated, tearful HENT: Atraumatic, normocephalic, oral mucosa pink and moist, airway patent. Nares patent without drainage. External ears normal. EYES: Conjunctiva clear, EOMI, PERRLA NECK: Trachea midline, non-tender, supple CARDIOVASCULAR: Tachycardic, Normal rhythm, No murmurs, rubs, gallops PULMONARY/CHEST: Clear to auscultation, no rhonchi, wheezes, or rales. Symmetrical breath sounds. Non-tender. ABDOMINAL: Non-distended, soft, non-tender - no rebound or guarding.  BS normal. NEUROLOGIC: Non-focal, moving all four extremities, no gross sensory or motor deficits. EXTREMITIES: No clubbing, cyanosis, or edema SKIN: Warm, Dry, No erythema, No rash, extensive small bruising to the lower extremities, brown in color Psychiatric: Agitated, will not sit still, anxious, flailing arms, delusional - visual hallucinations ED Course  Procedures (including critical care time)  Date: 02/12/2013  Rate: 74  Rhythm: normal sinus rhythm  QRS Axis: normal  Intervals: normal  ST/T Wave abnormalities: Flipped T waves in V3 and V4  Conduction Disutrbances:  none  Narrative Interpretation: No prior for comparison, flipped T waves in V3 and V4  Labs Reviewed  COMPREHENSIVE METABOLIC PANEL - Abnormal; Notable for the following:    GFR calc non Af Amer 88 (*)    All other components within normal limits  SALICYLATE LEVEL - Abnormal; Notable for the following:    Salicylate Lvl <2.0 (*)    All other components within normal limits  CBC WITH DIFFERENTIAL  ACETAMINOPHEN LEVEL  URINALYSIS, ROUTINE W REFLEX MICROSCOPIC  PREGNANCY, URINE  URINE RAPID DRUG SCREEN (HOSP PERFORMED)  ETHANOL    No results found. 1. Drug-induced delirium   2. Cocaine abuse     MDM  Gabrielle Baker is a 27 y.o. female witness to smoking crack at least 3 times yesterday as well as using heroin - patient is clearly delusional at this time, but this is likely secondary to drugs. Patient does not know if she took any hallucinogens including bath salts or LSD.  She has some mild tachycardia, she's not sure how her hypertensive do not think she's an agitated delirium at this time. Do not think she requires active cooling - secondary to delusions patient was given IM Geodon which worked well and calming her down.   Patient continues to be normotensive, she has a normal temperature, she's no longer tachycardic, without agitated delirium.  Not further medication indicated at this time. Patient to sober up, once she is medically cleared to be evaluated by ACT team for substance abuse or continued delirium.  Dr. Rhunette Croft assuming care at shift change.  Discussed with ACT team for evaluation.   Jones Skene, MD 02/12/13 1610  Jones Skene, MD 02/12/13 [id number]

## 2013-02-12 NOTE — BHH Counselor (Signed)
Gabrielle Baker, ACT counselor at [facility], submitted Pt for admission to [facility]. Gabrielle Baker, [facility] confirmed bed availability. Gabrielle Morn, PA reviewed clinical information and accepted Pt to the service of Thedore Mins, MD, room 407-2. Communicated this information to [employer].  Gabrielle Baker, LPC, [facility] Assessment Counselor

## 2013-02-13 ENCOUNTER — Encounter (HOSPITAL_COMMUNITY): Payer: Self-pay | Admitting: *Deleted

## 2013-02-13 DIAGNOSIS — F323 Major depressive disorder, single episode, severe with psychotic features: Secondary | ICD-10-CM

## 2013-02-13 DIAGNOSIS — F1114 Opioid abuse with opioid-induced mood disorder: Secondary | ICD-10-CM

## 2013-02-13 DIAGNOSIS — F1410 Cocaine abuse, uncomplicated: Secondary | ICD-10-CM

## 2013-02-13 MED ORDER — IBUPROFEN 400 MG PO TABS
400.0000 mg | ORAL_TABLET | Freq: Four times a day (QID) | ORAL | Status: DC | PRN
  Administered 2013-02-13 – 2013-02-14 (×3): 400 mg via ORAL
  Filled 2013-02-13 (×3): qty 1

## 2013-02-13 MED ORDER — TRAZODONE HCL 100 MG PO TABS: 100.0000 mg | ORAL_TABLET | Freq: Every evening | ORAL | Status: DC | PRN

## 2013-02-13 MED ORDER — ATENOLOL 50 MG PO TABS
50.0000 mg | ORAL_TABLET | Freq: Every day | ORAL | Status: DC
  Administered 2013-02-13 – 2013-02-15 (×4): 50 mg via ORAL
  Filled 2013-02-13 (×8): qty 1

## 2013-02-13 MED ORDER — FLUOXETINE HCL 10 MG PO CAPS
10.0000 mg | ORAL_CAPSULE | Freq: Every day | ORAL | Status: DC
Start: 2013-02-13 — End: 2013-02-16
  Administered 2013-02-13 – 2013-02-16 (×4): 10 mg via ORAL
  Filled 2013-02-13 (×5): qty 1
  Filled 2013-02-13: qty 14

## 2013-02-13 MED ORDER — BENZTROPINE MESYLATE 0.5 MG PO TABS
0.5000 mg | ORAL_TABLET | Freq: Two times a day (BID) | ORAL | Status: DC
  Administered 2013-02-13 – 2013-02-16 (×5): 0.5 mg via ORAL
  Filled 2013-02-13 (×8): qty 1

## 2013-02-13 MED ORDER — NICOTINE POLACRILEX 2 MG MT GUM
2.0000 mg | CHEWING_GUM | OROMUCOSAL | Status: DC | PRN
  Administered 2013-02-13: 2 mg via ORAL

## 2013-02-13 MED ORDER — HALOPERIDOL 2 MG PO TABS
2.0000 mg | ORAL_TABLET | Freq: Two times a day (BID) | ORAL | Status: DC
  Administered 2013-02-13 – 2013-02-14 (×3): 2 mg via ORAL
  Filled 2013-02-13 (×8): qty 1
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
## 2013-02-13 NOTE — Progress Notes (Signed)
Adult Psychoeducational Group Note  Date:  02/13/2013 Time:  1:59 PM  Group Topic/Focus:  Wellness Toolbox:   The focus of this group is to discuss various aspects of wellness, balancing those aspects and exploring ways to increase the ability to experience wellness.  Patients will create a wellness toolbox for use upon discharge.  Participation Level:  Did Not Attend  Participation Quality:  did not attend  Affect:  did not attend  Cognitive:  did not attend  Insight: None  Engagement in Group:  did not attend  Modes of Intervention:  Educational, Supportive, Discussion.   Additional Comments:  Did not attend   Nichola Sizer 02/13/2013, 1:59 PM
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
## 2013-02-13 NOTE — [facility] Group Notes (Signed)
[facility] LCSW Group Therapy  02/13/2013 1:15 pm  Type of Therapy: Process Group Therapy  Participation Level:  Did not attend    Summary of Progress/Problems: Today's group addressed the issue of overcoming obstacles.  Patients were asked to identify their biggest obstacle post d/c that stands in the way of their on-going success, and then problem solve as to how to manage this.  Gabrielle Baker 02/13/2013   5:37 PM

## 2013-02-13 NOTE — Progress Notes (Signed)
Recreation Therapy Notes  Date: 07.14.2014 Time: 9:30am Location: [address] Dayroom      Group Topic/Focus: Leisure Education  Participation Level: Did not attend  [name], LRT/CTRS  [name] 02/13/2013 12:18 PM

## 2013-02-13 NOTE — Progress Notes (Signed)
NUTRITION ASSESSMENT  Patient meets criteria for severe malnutrition related to social/environmental causes AEB weight loss of 20% in the past month and intake <50% in the past month. Pt identified as at risk on the Malnutrition Screen Tool  INTERVENTION: 1. Educated patient on the importance of nutrition and encouraged intake of food and beverages. 2. Discussed weight goals. 3. Supplements: MVI daily  NUTRITION DIAGNOSIS: Unintentional weight loss related to sub-optimal intake as evidenced by pt report.   Goal: Pt to meet >/= 90% of their estimated nutrition needs.  Monitor:  PO intake  Assessment:  Patient admitted with hallucinations.  Crack and heroine use.  First Cocaine use at age 28.  Currently homeless.  Patient reports a very good appetite and good po intake currently.  States she ate well prior to admit as well but reported UBW of 160 lbs 1 month ago. Chart consistent with UBW of 158 in December.  Patient reports weight use secondary to stress and increased drug use.  Patient with a 20% weight loss in the past month.  Intake <50% for >1 month.  Patient meets criteria for severe malnutrition related to social/environmental causes.  27 y.o. female  Height: Ht Readings from Last 1 Encounters:  02/12/13 5\' 4"  (1.626 m)    Weight: Wt Readings from Last 1 Encounters:  02/12/13 124 lb (56.246 kg)    Weight Hx: Wt Readings from Last 10 Encounters:  02/12/13 124 lb (56.246 kg)  07/29/12 158 lb (71.668 kg)    BMI:  Body mass index is 21.27 kg/(m^2). Pt meets criteria for wnl based on current BMI.  Estimated Nutritional Needs: Kcal: 25-30 kcal/kg Protein: > 1 gram protein/kg Fluid: 1 ml/kcal  Diet Order: General Pt is also offered choice of unit snacks mid-morning and mid-afternoon.  Pt is eating as desired.   Lab results and medications reviewed.   Oran Rein, RD, LDN Clinical Inpatient Dietitian Pager:  [phone] Weekend and after hours pager:   [phone]

## 2013-02-13 NOTE — Tx Team (Signed)
Initial Interdisciplinary Treatment Plan  PATIENT STRENGTHS: (choose at least two) Average or above average intelligence Communication skills Religious Affiliation  PATIENT STRESSORS: Financial difficulties Loss of boyfriend Occupational concerns Substance abuse   PROBLEM LIST: Problem List/Patient Goals Date to be addressed Date deferred Reason deferred Estimated date of resolution  AVH 02/12/13     SI/HI 02/12/13     Substance abuse 02/12/13     Homelessness 02/12/13                                    DISCHARGE CRITERIA:  Ability to meet basic life and health needs Adequate post-discharge living arrangements Improved stabilization in mood, thinking, and/or behavior Need for constant or close observation no longer present Reduction of life-threatening or endangering symptoms to within safe limits Safe-care adequate arrangements made Verbal commitment to aftercare and medication compliance Withdrawal symptoms are absent or subacute and managed without 24-hour nursing intervention  PRELIMINARY DISCHARGE PLAN: Attend aftercare/continuing care group Attend 12-step recovery group Placement in alternative living arrangements  PATIENT/FAMIILY INVOLVEMENT: This treatment plan has been presented to and reviewed with the patient, Gabrielle Baker, and/or family member.  The patient and family have been given the opportunity to ask questions and make suggestions.  Merian Capron [facility] 02/13/2013, 1:42 AM

## 2013-02-13 NOTE — Progress Notes (Signed)
This is a first psych admit (volunatrily) via [facility] for this 27 yr old homeless SWF. Pt found walking in middle of the street hoping "God would save me." Pt states she has been seeing and hearing the voice of the devil since age 71 when she first began using crack cocaine. She states at times she uses to suppress the hallucinations however at other times she hallucinates while using and reports "the devil tells me to use until I die, to give him some." Pt also endorses passive HI toward "everyone - those I use with, those who sell to me, everyone. I'm just angry." Pt is unemployed and had recent breakup with boyfriend who was using crack as well. Pt UDS+ for opiates but pt denies use of. No ETOH use. Denies health px, prior surgery though on admit pt's BP is 142/100 manually. COWS is a 5. Pt reports Oriented to unit and meal provided. PA paged and orders received to address BP. Prn's given and on reassess patient is sleeping without distress. Contracts for safety on unit, safe at present. Lawrence Marseilles
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
## 2013-02-13 NOTE — [facility] Group Notes (Signed)
[facility] LCSW Aftercare Discharge Planning Group Note   02/13/2013 8:31 AM  Participation Quality:  Did not attend    [place]

## 2013-02-14 MED ORDER — NICOTINE 21 MG/24HR TD PT24
21.0000 mg | MEDICATED_PATCH | Freq: Every day | TRANSDERMAL | Status: DC
  Administered 2013-02-14 – 2013-02-16 (×2): 21 mg via TRANSDERMAL
  Filled 2013-02-14 (×7): qty 1

## 2013-02-14 NOTE — Progress Notes (Signed)
D: Pt did not attend group today. Pt still reports seeing the devil. However, pt did get up to briefly go to the break room for a snack. She also received her meds at the med window ( with other patients present). She has been complaining of some abdominal pain related to her menses. An order for Motrin was obtained. The pharmacy D/C her prn dose of naproxen. Pt made aware. Pt is passive for SI and verbally contracts for safety. A: Writer administered scheduled and prn medications to pt. Continued support and availability as needed was extended to this pt. Staff continue to monitor pt with q20min checks.  R: No adverse drug reactions noted. Pt receptive to treatment. Pt remains safe at this time.

## 2013-02-14 NOTE — Progress Notes (Signed)
D: Pt continues to see and hear the voice of the devil. Pt refuses to go to groups because she say people put black stuff on her. Pt is passive SI/HI. Pt contracts for safety at this time. Pt isolates in her room, however pt do attend meals and snack times. Pt presents with flat affect/anxious/depressed/worried and disorganized thoughts. A: Medications administered as ordered per MD. Verbal support given. Pt encouraged to attend groups. 15 minute checks performed for pt safety. R: Pt remains safe at this time. Pt in bed resting. No adverse reactions to meds verbalized by pt.

## 2013-02-14 NOTE — [facility] Group Notes (Signed)
[facility] LCSW Group Therapy  02/14/2013 , 4:31 PM   Type of Therapy:  Group Therapy  Participation Level:  Did not attend    Summary of Progress/Problems: Today's group focused on the term Diagnosis.  Participants were asked to define the term, and then pronounce whether it is a negative, positive or neutral term.  Daryel Gerald B 02/14/2013 , 4:31 PM

## 2013-02-14 NOTE — Progress Notes (Signed)
D: Pt did not attend group tonight. Pt expressed being very sleepy and unable to attend. Pt went to day room for snack. She did not come to window for scheduled medications. Flat affect, expressed still seeing the devil and hearing him. Pt is passive for SI and verbally contracts for safety.  A: Writer administered scheduled medication at bedside. No PRNs given at this time, Continues support as needed. Staff continues to monitor pt with q15 min. Checks.  R: No adverse drug reactions noted. Pt is receptive to treatment. Pt remains safe at this time.

## 2013-02-14 NOTE — BHH Group Notes (Signed)
[facility] LCSW Aftercare Discharge Planning Group Note   02/14/2013 8:20 AM  Participation Quality:  Did not attend    [place]

## 2013-02-14 NOTE — Tx Team (Signed)
  Interdisciplinary Treatment Plan Update   Date Reviewed:  02/14/2013  Time Reviewed:  8:20 AM  Progress in Treatment:   Attending groups: Yes Participating in groups: Yes Taking medication as prescribed: Yes  Tolerating medication: Yes Family/Significant other contact made:   Patient understands diagnosis: Yes  As evidenced by asking for help with psychosis Discussing patient identified problems/goals with staff: Yes  See initial plan Medical problems stabilized or resolved: Yes Denies suicidal/homicidal ideation: No but contracts for safety Patient has not harmed self or others: Yes  For review of initial/current patient goals, please see plan of care.  Estimated Length of Stay:  4-5 days  Reason for Continuation of Hospitalization: Depression Hallucinations Homicidal ideation Medication stabilization Suicidal ideation  New Problems/Goals identified:  N/A  Discharge Plan or Barriers:   unknown  States she has no ploace to go at d/c  Additional Comments:  "I have been seeing devil since I was 27 years old."  History of Present Illness:: Patient is a 27 year woman, single, unemployed, homeless mother of 3 children. Patient presents with auditory/visual hallucinations, depression, racing thoughts, paranoia, suicidal and homicidal thoughts. She reports long history of drug abuse dating back to age 27 when her mother introduced her to crack cocaine. Patient reports a past history of suicidal attempts and psychiatric hospitalizations but she was not able to report the name of the hospitals or the year in which she was hospitalized. Patient reports that she has been depressed all her life, she also claimed that she has been seeing and hearing the voice of devil since age 27. She claims that the devil tells her: "you are stupid, sell me your soul, you are no good." Patient reports that she does drugs to drown the voices of the devil.   Attendees:  Signature: Thedore Mins, MD  02/14/2013 8:20 AM   Signature: Richelle Ito, LCSW 02/14/2013 8:20 AM  Signature: Verne Spurr, PA 02/14/2013 8:20 AM  Signature: Joslyn Devon, RN 02/14/2013 8:20 AM  Signature: Liborio Nixon, RN 02/14/2013 8:20 AM  Signature:  02/14/2013 8:20 AM  Signature:   02/14/2013 8:20 AM  Signature:    Signature:    Signature:    Signature:    Signature:    Signature:      Scribe for Treatment Team:   Richelle Ito, LCSW  02/14/2013 8:20 AM

## 2013-02-15 DIAGNOSIS — F259 Schizoaffective disorder, unspecified: Secondary | ICD-10-CM

## 2013-02-15 DIAGNOSIS — F1110 Opioid abuse, uncomplicated: Secondary | ICD-10-CM

## 2013-02-15 MED ORDER — HALOPERIDOL 5 MG PO TABS
5.0000 mg | ORAL_TABLET | Freq: Every day | ORAL | Status: DC
  Administered 2013-02-15: 5 mg via ORAL
  Filled 2013-02-15 (×2): qty 1
  Filled 2013-02-15: qty 14

## 2013-02-15 NOTE — Progress Notes (Signed)
D: Pt denies SI this morning. Pt continues to hear and see the devil. Pt feeling drowsy this am. Pt refused to take scheduled med Haldol this morning d/t sedation. Pt in bed resting this morning. No distress noted. Pt irritable an agitated this morning on approach. A: Medications administered as ordered per MD. Verbal support given. Pt encouraged to attend groups. 15 minute checks performed for safety. R: Pt remains safe at this time.

## 2013-02-15 NOTE — Progress Notes (Signed)
Recreation Therapy Notes  Date: 07.16.2014 Time: 9:30am Location: [address] Day Room      Group Topic/Focus: Memory  Participation Level: Did not attend.   Marykay Lex Shir Bergman, LRT/CTRS  [name] 02/15/2013 12:27 PM

## 2013-02-15 NOTE — Progress Notes (Signed)
[facility] MD Progress Note  02/15/2013 2:47 PM Gabrielle Baker  MRN:  [id number] Subjective:  I am too sleepy, all I want to do is sleep."  Objective:  Patient is awake but lethargic. She denies WD symptoms from opiates.       She states she is truly homicidal and suicidal with plans and intentions for both. She states she wants to "blow their heads off" in reference to drug dealers and she will not divulge her plans for her own suicide but states she has many,. Diagnosis:  Schizoaffective disorder, opiate abuse.  ADL's:  Impaired  Sleep: Good  Appetite:  Good  Suicidal Ideation:  Yes with plans and intent. Can contract for safety on the unit. Homicidal Ideation:  Yes, with intent, access and plan to "blow their heads off." AEB (as evidenced by):  Psychiatric Specialty Exam: ROS  Blood pressure 109/71, pulse 60, temperature 98.3 F (36.8 C), temperature source Oral, resp. rate 20, height 5\' 4"  (1.626 m), weight 56.246 kg (124 lb), last menstrual period 01/22/2013, SpO2 99.00%.Body mass index is 21.27 kg/(m^2).  General Appearance: Disheveled  Eye Contact::  Poor  Speech:  Clear and Coherent  Volume:  Normal  Mood:  Anxious  Affect:  Labile  Thought Process:  Disorganized  Orientation:  NA  Thought Content:  Hallucinations: Auditory Visual  Suicidal Thoughts:  Yes.  with intent/plan  Homicidal Thoughts:  No  Memory:  NA  Judgement:  Impaired  Insight:  Lacking  Psychomotor Activity:  Decreased  Concentration:  Poor  Recall:  Poor  Akathisia:  No  Handed:  Right  AIMS (if indicated):     Assets:  Resilience  Sleep:  Number of Hours: 6.5   Current Medications: Current Facility-Administered Medications  Medication Dose Route Frequency Provider Last Rate Last Dose  . acetaminophen (TYLENOL) tablet 650 mg  650 mg Oral Q6H PRN Court Joy, PA-C   650 mg at 02/13/13 1703  . alum & mag hydroxide-simeth (MAALOX/MYLANTA) 200-200-20 MG/5ML suspension 30 mL  30 mL Oral Q4H PRN  Court Joy, PA-C      . atenolol (TENORMIN) tablet 50 mg  50 mg Oral QHS Court Joy, PA-C   50 mg at 02/14/13 2217  . benztropine (COGENTIN) tablet 0.5 mg  0.5 mg Oral BID Mojeed Akintayo   0.5 mg at 02/14/13 1721  . FLUoxetine (PROZAC) capsule 10 mg  10 mg Oral Daily Mojeed Akintayo   10 mg at 02/15/13 0824  . haloperidol (HALDOL) tablet 5 mg  5 mg Oral QHS Verne Spurr, PA-C      . hydrOXYzine (ATARAX/VISTARIL) tablet 25 mg  25 mg Oral Q6H PRN Court Joy, PA-C   25 mg at 02/13/13 2142  . ibuprofen (ADVIL,MOTRIN) tablet 400 mg  400 mg Oral Q6H PRN Kerry Hough, PA-C   400 mg at 02/14/13 1722  . loperamide (IMODIUM) capsule 2-4 mg  2-4 mg Oral PRN Court Joy, PA-C      . magnesium hydroxide (MILK OF MAGNESIA) suspension 30 mL  30 mL Oral Daily PRN Court Joy, PA-C      . nicotine (NICODERM CQ - dosed in mg/24 hours) patch 21 mg  21 mg Transdermal Daily Mojeed Akintayo   21 mg at 02/14/13 4098    Lab Results: No results found for this or any previous visit (from the past 48 hour(s)).  Physical Findings: AIMS: Facial and Oral Movements Muscles of Facial Expression: None, normal Lips and Perioral Area:  None, normal Jaw: None, normal Tongue: None, normal,Extremity Movements Upper (arms, wrists, hands, fingers): Minimal Lower (legs, knees, ankles, toes): None, normal, Trunk Movements Neck, shoulders, hips: None, normal, Overall Severity Severity of abnormal movements (highest score from questions above): None, normal Incapacitation due to abnormal movements: None, normal Patient's awareness of abnormal movements (rate only patient's report): No Awareness, Dental Status Current problems with teeth and/or dentures?: No Does patient usually wear dentures?: No  CIWA:  CIWA-Ar Total: 8 COWS:  COWS Total Score: 5  Treatment Plan Summary: Daily contact with patient to assess and evaluate symptoms and progress in treatment Medication management  Plan: 1. Continue  crisis management and stabilization. 2. Medication management to reduce current symptoms to base line and improve patient's overall level of functioning 3. Treat health problems as indicated. 4. Develop treatment plan to decrease risk of relapse upon discharge and the need for readmission. 5. Psycho-social education regarding relapse prevention and self care. 6. Health care follow up as needed for medical problems. 7. Continue home medications where appropriate. 8. Will continue as written.  Will continue this admission due to her poor response to [facility] Decision Making Problem Points:  Established problem, worsening (2) and Review of psycho-social stressors (1) Data Points:  Review or order medicine tests (1) Review of medication regiment & side effects (2)  I certify that inpatient services furnished can reasonably be expected to improve the patient's condition.  Rona Ravens. Connie Hilgert RPAC 9:48 PM 02/15/2013

## 2013-02-15 NOTE — Progress Notes (Signed)
The focus of this group is to help patients review their daily goal of treatment and discuss progress on daily workbooks. Pt attended the evening group session and responded to all discussion prompts from the Writer. Pt stated that she had an okay day, but was happy to report that this was the first group she was both awake for and attending, to which her peers cheered. Pt also reported that the feelings of drowsiness that had troubled her in previous days were decreased today. 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 Pt's affect was bright.
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
## 2013-02-15 NOTE — Progress Notes (Signed)
Adult Psychoeducational Group Note  Date:  02/15/2013 Time:  11:00am Group Topic/Focus:    Participation Level:    Participation Quality:      Cognitive:    Insight:   Engagement in Group:    Modes of Intervention:    Additional Comments:  Did not attend group  Shelly Bombard D 02/15/2013, 2:12 PM

## 2013-02-15 NOTE — BHH Group Notes (Signed)
[facility] LCSW Aftercare Discharge Planning Group Note   02/15/2013 8:03 AM  Participation Quality:  Did not attend    [place]

## 2013-02-15 NOTE — BHH Group Notes (Signed)
BHH Group Notes:  (Counselor/Nursing/MHT/Case Management/Adjunct)  02/15/2013 1:15PM  Type of Therapy:  Group Therapy  Participation Level:  Did not attend    Summary of Progress/Problems: The topic for group was balance in life.  Pt participated in the discussion about when their life was in balance and out of balance and how this feels.  Pt discussed ways to get back in balance and short term goals they can work on to get where they want to be.    Daryel Gerald B 02/15/2013 10:53 AM

## 2013-02-16 DIAGNOSIS — F1420 Cocaine dependence, uncomplicated: Secondary | ICD-10-CM

## 2013-02-16 MED ORDER — BENZTROPINE MESYLATE 1 MG PO TABS
1.0000 mg | ORAL_TABLET | Freq: Every day | ORAL | Status: DC
  Filled 2013-02-16: qty 14

## 2013-02-16 MED ORDER — FLUOXETINE HCL 10 MG PO CAPS: 10.0000 mg | ORAL_CAPSULE | Freq: Every day | ORAL | Status: DC

## 2013-02-16 MED ORDER — HALOPERIDOL 5 MG PO TABS: 5.0000 mg | ORAL_TABLET | Freq: Every day | ORAL | Status: DC

## 2013-02-16 MED ORDER — BENZTROPINE MESYLATE 1 MG PO TABS: 1.0000 mg | ORAL_TABLET | Freq: Every day | ORAL | Status: DC

## 2013-02-16 MED ORDER — ATENOLOL 50 MG PO TABS: 50.0000 mg | ORAL_TABLET | Freq: Every day | ORAL | Status: DC

## 2013-02-16 MED ORDER — BENZTROPINE MESYLATE 0.5 MG PO TABS: 0.5000 mg | ORAL_TABLET | Freq: Two times a day (BID) | ORAL | Status: DC

## 2013-02-16 NOTE — Tx Team (Signed)
  Interdisciplinary Treatment Plan Update   Date Reviewed:  02/16/2013  Time Reviewed:  11:30 AM  Progress in Treatment:   Attending groups: Yes Participating in groups: Yes Taking medication as prescribed: Yes  Tolerating medication: Yes Family/Significant other contact made: No  Patient understands diagnosis: Yes  Discussing patient identified problems/goals with staff: Yes Medical problems stabilized or resolved: Yes Denies suicidal/homicidal ideation: Yes Patient has not harmed self or others: Yes  For review of initial/current patient goals, please see plan of care.  Estimated Length of Stay:  D/C today  Reason for Continuation of Hospitalization:   New Problems/Goals identified:  N/A  Discharge Plan or Barriers:   stay with a friend, follow up [facility]  Additional Comments:  Attendees:  Signature: Thedore Mins, MD 02/16/2013 11:30 AM   Signature: Richelle Ito, LCSW 02/16/2013 11:30 AM  Signature: Verne Spurr, PA 02/16/2013 11:30 AM  Signature: Joslyn Devon, RN 02/16/2013 11:30 AM  Signature:  02/16/2013 11:30 AM  Signature:  02/16/2013 11:30 AM  Signature:   02/16/2013 11:30 AM  Signature:    Signature:    Signature:    Signature:    Signature:    Signature:      Scribe for Treatment Team:   Richelle Ito, LCSW  02/16/2013 11:30 AM

## 2013-02-16 NOTE — Progress Notes (Signed)
D: Pt is bright in affect and anxious in mood. Pt attended group this evening. This is important breakthrough for this pt. Pt is currently denying any depression or anxiety. Pt is also denying any SI/HI/AVH. Pt observed interacting appropriately within the milieu.  A: Writer administered scheduled medications to pt. Continued support and availability as needed was extended to this pt. Staff continue to monitor pt with q5min checks.  R: No adverse drug reactions noted. Pt receptive to treatment. Pt remains safe at this time.

## 2013-02-16 NOTE — BHH Suicide Risk Assessment (Signed)
BHH INPATIENT:  Family/Significant Other Suicide Prevention Education  Suicide Prevention Education:  Patient Refusal for Family/Significant Other Suicide Prevention Education: The patient Amberlee Garvey has refused to provide written consent for family/significant other to be provided Family/Significant Other Suicide Prevention Education during admission and/or prior to discharge.  Physician notified. Marvella states she has no one to contact except for boyfriend, and he does not have a phone.  Daryel Gerald B 02/16/2013, 12:05 PM

## 2013-02-16 NOTE — Progress Notes (Signed)
[facility] Adult Case Management Discharge Plan :  Will you be returning to the same living situation after discharge: No. At discharge, do you have transportation home?:Yes,  bus pass Do you have the ability to pay for your medications:Yes,  mental health  Release of information consent forms completed and in the chart;  Patient's signature needed at discharge.  Patient to Follow up at: Follow-up Information   Follow up with [facility]. (Go to the walk-in clinic between 8 and 9AM  M-F for your hospital follow up appointment.)    Contact information:   [address]  [place]  [phone]      Patient denies SI/HI:   Yes,  yes    Safety Planning and Suicide Prevention discussed:  Yes,  yes  Gabrielle Baker 02/16/2013, 11:28 AM

## 2013-02-16 NOTE — Progress Notes (Signed)
Pt has been discharged. Pt instructed about aftercare, given belongings. Pt;s mood stable, denies SI/HI/AV.

## 2013-02-16 NOTE — BHH Suicide Risk Assessment (Signed)
Suicide Risk Assessment  Discharge Assessment     Demographic Factors:  Low socioeconomic status, Unemployed and female  Mental Status Per Nursing Assessment::   On Admission:  Suicidal ideation indicated by patient;Suicide plan;Plan includes specific time, place, or method;Self-harm thoughts;Self-harm behaviors;Intention to act on suicide plan;Belief that plan would result in death;Thoughts of violence towards others  Current Mental Status by Physician: patient denies suicidal ideations, intent or plan.  Loss Factors: Financial problems/change in socioeconomic status  Historical Factors: Family history of mental illness or substance abuse and Impulsivity  Risk Reduction Factors:   Sense of responsibility to family and Living with another person, especially a relative  Continued Clinical Symptoms:  Alcohol/Substance Abuse/Dependencies  Cognitive Features That Contribute To Risk:  Closed-mindedness    Suicide Risk:  Minimal: No identifiable suicidal ideation.  Patients presenting with no risk factors but with morbid ruminations; may be classified as minimal risk based on the severity of the depressive symptoms  Discharge Diagnoses:   AXIS I:  Major depressive disorder, single episode, severe, specified as with psychotic behavior              Cocaine dependence. Opioid abuse  AXIS II:  Deferred AXIS III:   Past Medical History  Diagnosis Date  . No pertinent past medical history    AXIS IV:  other psychosocial or environmental problems and problems related to social environment AXIS V:  61-70 mild symptoms  Plan Of Care/Follow-up recommendations:  Activity:  as tolerated Diet:  healthy Tests:  routine Other:  patient to keep her after care appointment  Is patient on multiple antipsychotic therapies at discharge:  No   Has Patient had three or more failed trials of antipsychotic monotherapy by history:  No  Recommended Plan for Multiple Antipsychotic  Therapies: N/A  Gabrielle Guettler,MD 02/16/2013, 10:31 AM

## 2013-02-16 NOTE — Discharge Summary (Signed)
Physician Discharge Summary Note  Patient:  Gabrielle Baker is an 27 y.o., female MRN:  [id number] DOB:  05-10-1986 Patient phone:  [phone] (home)  Patient address:   [address] [place] [address],   Date of Admission:  02/12/2013 Date of Discharge: 02/16/2013  Reason for Admission:  Psychosis  Discharge Diagnoses: Principal Problem:   Major depressive disorder, single episode, severe, specified as with psychotic behavior Active Problems:   Cocaine abuse   Opioid abuse with opioid-induced mood disorder  Review of Systems  Constitutional: Negative.  Negative for fever, chills, weight loss, malaise/fatigue and diaphoresis.  HENT: Negative for congestion and sore throat.   Eyes: Negative for blurred vision, double vision and photophobia.  Respiratory: Negative for cough, shortness of breath and wheezing.   Cardiovascular: Negative for chest pain, palpitations and PND.  Gastrointestinal: Negative for heartburn, nausea, vomiting, abdominal pain, diarrhea and constipation.  Musculoskeletal: Negative for myalgias, joint pain and falls.  Neurological: Negative for dizziness, tingling, tremors, sensory change, speech change, focal weakness, seizures, loss of consciousness, weakness and headaches.  Endo/Heme/Allergies: Negative for polydipsia. Does not bruise/bleed easily.  Psychiatric/Behavioral: Negative for depression, suicidal ideas, hallucinations, memory loss and substance abuse. The patient is not nervous/anxious and does not have insomnia.    Discharge Diagnoses:  AXIS I: Major depressive disorder, single episode, severe, specified as with psychotic behavior  Cocaine dependence. Opioid abuse  AXIS II: Deferred  AXIS III:  Past Medical History   Diagnosis  Date   .  No pertinent past medical history    AXIS IV: other psychosocial or environmental problems and problems related to social environment  AXIS V: 61-70 mild symptoms  Level of Care:  OP  Hospital  Course:         This is an involuntary admission for Gabrielle Baker told who is a 27 year old female brought to the emergency room by a car when passersby noticed or walking down the street acting erratically. Upon arrival in the ED the patient was delusional, disorganized and combative. She was incoherent stating that she has smoked crack at least 3 times that day and was also using heroin. She was tachycardic with an abnormal EKG, as is urine drug screen was positive for cocaine only. Due to her combative state she was given 20 mg of Geodon IM for sedation. She was felt to be delirious from substance abuse. She was given medical clearance and transferred to behavioral health for further stabilization and treatment.      Upon arrival at the unit Gabrielle Baker was evaluated and found to be psychotic and delusional reporting auditory and visual hallucinations, racing thoughts suicidal and homicidal thoughts with a long history of substance abuse. The patient stated she was introduced to crack cocaine at age 86 by her mother.  Orientation the unit was difficult given her psychosis, and the patient was directed to her room.      Haldol was started at 5 mg, as well as Cogentin 1 mg for her symptoms. The patient isolated to her room, waking only for meals and did not attend groups. She was not interested in substance abuse treatment upon discharge, she did not feel that treatment was necessary. Gabrielle Baker stated that she had heard voices all of her life and that she enjoyed talking to the devil. She requested an ask for discharge after 72 hours, denies suicidal or homicidal ideation, stating that the voices were no longer a problem for her. As she was not interested in any further treatment she  was discharged per her request. Consults:  None  Significant Diagnostic Studies:  None  Discharge Vitals:   Blood pressure 98/62, pulse 65, temperature 98.3 F (36.8 C), temperature source Oral, resp. rate 18, height 5\' 4"  (1.626 m),  weight 56.246 kg (124 lb), last menstrual period 01/22/2013, SpO2 99.00%. Body mass index is 21.27 kg/(m^2). Lab Results:   No results found for this or any previous visit (from the past 72 hour(s)).  Physical Findings: AIMS: Facial and Oral Movements Muscles of Facial Expression: None, normal Lips and Perioral Area: None, normal Jaw: None, normal Tongue: None, normal,Extremity Movements Upper (arms, wrists, hands, fingers): None, normal Lower (legs, knees, ankles, toes): None, normal, Trunk Movements Neck, shoulders, hips: None, normal, Overall Severity Severity of abnormal movements (highest score from questions above): None, normal Incapacitation due to abnormal movements: None, normal Patient's awareness of abnormal movements (rate only patient's report): No Awareness, Dental Status Current problems with teeth and/or dentures?: No Does patient usually wear dentures?: No  CIWA:  CIWA-Ar Total: 8 COWS:  COWS Total Score: 5  Psychiatric Specialty Exam: See Psychiatric Specialty Exam and Suicide Risk Assessment completed by Attending Physician prior to discharge.  Discharge destination:  Home  Is patient on multiple antipsychotic therapies at discharge:  No   Has Patient had three or more failed trials of antipsychotic monotherapy by history:  No  Recommended Plan for Multiple Antipsychotic Therapies: Not applicable   Discharge Orders   Future Orders Complete By Expires     Diet - low sodium heart healthy  As directed     Discharge instructions  As directed     Comments:      Take all your medications as prescribed by your mental healthcare provider. Report any adverse effects and or reactions from your medicines to your outpatient provider promptly. Patient is instructed and cautioned to not engage in alcohol and or illegal drug use while on prescription medicines. In the event of worsening symptoms, patient is instructed to call the crisis hotline, 911 and or go to the  nearest ED for appropriate evaluation and treatment of symptoms. Follow-up with your primary care provider for your other medical issues, concerns and or health care needs.    Increase activity slowly  As directed         Medication List       Indication   atenolol 50 MG tablet  Commonly known as:  TENORMIN  Take 1 tablet (50 mg total) by mouth at bedtime.   Indication:  Fine to Coarse Slow Tremor affecting Head, Hands & Voice     benztropine 0.5 MG tablet  Commonly known as:  COGENTIN  Take 1 tablet (0.5 mg total) by mouth 2 (two) times daily.   Indication:  Extrapyramidal Reaction caused by Medications     FLUoxetine 10 MG capsule  Commonly known as:  PROZAC  Take 1 capsule (10 mg total) by mouth daily.   Indication:  Major Depressive Disorder     haloperidol 5 MG tablet  Commonly known as:  HALDOL  Take 1 tablet (5 mg total) by mouth at bedtime.   Indication:  Psychosis         Follow-up recommendations:   Activities: Resume activity as tolerated. Diet: Heart healthy low sodium diet Tests: Follow up testing will be determined by your out patient provider. Comments:    Total Discharge Time:  Greater than 30 minutes.  Signed: Rona Ravens. Ashyia Schraeder RPAC 10:15 AM 02/16/2013

## 2013-02-16 NOTE — Progress Notes (Signed)
Adult Psychoeducational Group Note  Date:  02/16/2013 Time:  11:44 AM  Group Topic/Focus:  Overcoming Stress:   The focus of this group is to define stress and help patients assess their triggers.  Participation Level:  Did Not Attend  Additional Comments:  Pt did not attend group despite being encouraged to do so by staff.  Reinaldo Raddle K 02/16/2013, 11:44 AM

## 2013-02-16 NOTE — Progress Notes (Signed)
Seen and agreed. Jaideep Pollack, MD 

## 2013-02-21 NOTE — Progress Notes (Signed)
Patient Discharge Instructions:  After Visit Summary (AVS):   Faxed to:  02/21/13 Psychiatric Admission Assessment Note:   Faxed to:  02/21/13 Suicide Risk Assessment - Discharge Assessment:   Faxed to:  02/21/13 Faxed/Sent to the Next Level Care provider:  02/21/13 Faxed to [facility] @ [phone]  Jerelene Redden, 02/21/2013, 3:58 PM

## 2013-02-22 NOTE — Discharge Summary (Signed)
Seen and agreed. Severiano Utsey, MD 

## 2013-03-25 ENCOUNTER — Emergency Department (HOSPITAL_COMMUNITY): Payer: Self-pay

## 2013-03-25 ENCOUNTER — Encounter (HOSPITAL_COMMUNITY): Payer: Self-pay | Admitting: Emergency Medicine

## 2013-03-25 ENCOUNTER — Emergency Department (HOSPITAL_COMMUNITY)
Admission: EM | Admit: 2013-03-25 | Discharge: 2013-03-25 | Disposition: A | Discharge status: Home / Self Care | Payer: Self-pay | Attending: Emergency Medicine | Admitting: Emergency Medicine

## 2013-03-25 DIAGNOSIS — J45909 Unspecified asthma, uncomplicated: Secondary | ICD-10-CM | POA: Insufficient documentation

## 2013-03-25 DIAGNOSIS — Z9109 Other allergy status, other than to drugs and biological substances: Secondary | ICD-10-CM | POA: Insufficient documentation

## 2013-03-25 DIAGNOSIS — R0789 Other chest pain: Secondary | ICD-10-CM | POA: Insufficient documentation

## 2013-03-25 DIAGNOSIS — F329 Major depressive disorder, single episode, unspecified: Secondary | ICD-10-CM | POA: Insufficient documentation

## 2013-03-25 DIAGNOSIS — H579 Unspecified disorder of eye and adnexa: Secondary | ICD-10-CM | POA: Insufficient documentation

## 2013-03-25 DIAGNOSIS — Z79899 Other long term (current) drug therapy: Secondary | ICD-10-CM | POA: Insufficient documentation

## 2013-03-25 DIAGNOSIS — F1910 Other psychoactive substance abuse, uncomplicated: Secondary | ICD-10-CM | POA: Insufficient documentation

## 2013-03-25 DIAGNOSIS — F17200 Nicotine dependence, unspecified, uncomplicated: Secondary | ICD-10-CM | POA: Insufficient documentation

## 2013-03-25 DIAGNOSIS — F3289 Other specified depressive episodes: Secondary | ICD-10-CM | POA: Insufficient documentation

## 2013-03-25 DIAGNOSIS — F489 Nonpsychotic mental disorder, unspecified: Secondary | ICD-10-CM | POA: Insufficient documentation

## 2013-03-25 DIAGNOSIS — J45901 Unspecified asthma with (acute) exacerbation: Secondary | ICD-10-CM

## 2013-03-25 LAB — CBC WITH DIFFERENTIAL/PLATELET
Basophils Relative: 0 % (ref 0–1)
Hemoglobin: 11.5 g/dL — ABNORMAL LOW (ref 12.0–15.0)
MCHC: 33.2 g/dL (ref 30.0–36.0)
Monocytes Relative: 8 % (ref 3–12)
Neutro Abs: 5.7 10*3/uL (ref 1.7–7.7)
Neutrophils Relative %: 66 % (ref 43–77)
Platelets: 243 10*3/uL (ref 150–400)
RBC: 3.77 MIL/uL — ABNORMAL LOW (ref 3.87–5.11)

## 2013-03-25 LAB — POCT I-STAT TROPONIN I: Troponin i, poc: 0.01 ng/mL (ref 0.00–0.08)

## 2013-03-25 LAB — POCT I-STAT, CHEM 8
Chloride: 103 mEq/L (ref 96–112)
Glucose, Bld: 100 mg/dL — ABNORMAL HIGH (ref 70–99)
HCT: 35 % — ABNORMAL LOW (ref 36.0–46.0)
Potassium: 4 mEq/L (ref 3.5–5.1)
Sodium: 139 mEq/L (ref 135–145)

## 2013-03-25 MED ORDER — ALBUTEROL SULFATE HFA 108 (90 BASE) MCG/ACT IN AERS
1.0000 | INHALATION_SPRAY | Freq: Once | RESPIRATORY_TRACT | Status: AC
  Administered 2013-03-25: 1 via RESPIRATORY_TRACT
  Filled 2013-03-25: qty 6.7

## 2013-03-25 MED ORDER — PREDNISONE 20 MG PO TABS: ORAL_TABLET | ORAL | Status: DC

## 2013-03-25 MED ORDER — LORATADINE 10 MG PO TABS: 10.0000 mg | ORAL_TABLET | Freq: Every day | ORAL | Status: DC

## 2013-03-25 MED ORDER — ALBUTEROL SULFATE HFA 108 (90 BASE) MCG/ACT IN AERS: 1.0000 | INHALATION_SPRAY | Freq: Four times a day (QID) | RESPIRATORY_TRACT | Status: DC | PRN

## 2013-03-25 NOTE — ED Notes (Signed)
Patient returned from XRay at this time.

## 2013-03-25 NOTE — ED Notes (Addendum)
Pt reports moving into a new house approx 3 days ago and states it is dusty.  C/o sob, sore throat, and pain across upper chest.  Denies nausea or vomiting. Pt also requesting to get lab results from last time she was in ED.

## 2013-03-25 NOTE — ED Provider Notes (Addendum)
CSN: [id number]     Arrival date & time 03/25/13  0308 History     First MD Initiated Contact with Patient 03/25/13 0449     Chief Complaint  Patient presents with  . Shortness of Breath  . Chest Pain   (Consider location/radiation/quality/duration/timing/severity/associated sxs/prior Treatment) Patient is a 27 y.o. female presenting with chest pain. The history is provided by the patient.  Chest Pain Pain location:  Substernal area Pain quality: tightness   Pain quality: not tearing   Pain radiates to:  Does not radiate Pain radiates to the back: no   Pain severity:  Mild Onset quality:  Gradual Duration:  1 day Timing:  Constant Progression:  Unchanged Chronicity:  Recurrent Context comment:  Dust exposure in new home with wheezing and coughing Relieved by:  Nothing Worsened by:  Nothing tried Ineffective treatments:  None tried Associated symptoms: cough   Associated symptoms: no back pain, no fever and no weakness   Risk factors: no surgery     Past Medical History  Diagnosis Date  . No pertinent past medical history   . Polysubstance abuse   . Mental disorder   . Depression    Past Surgical History  Procedure Laterality Date  . No past surgeries     No family history on file. History  Substance Use Topics  . Smoking status: Current Every Day Smoker -- 1.00 packs/day    Types: Cigarettes  . Smokeless tobacco: Not on file  . Alcohol Use: No   OB History   Grav Para Term Preterm Abortions TAB SAB Ect Mult Living   2 1 1       1      Review of Systems  Constitutional: Negative for fever.  Eyes: Positive for itching.  Respiratory: Positive for cough and wheezing.   Cardiovascular: Positive for chest pain.  Musculoskeletal: Negative for back pain.  Neurological: Negative for weakness.  All other systems reviewed and are negative.    Allergies  Review of patient's allergies indicates no known allergies.  Home Medications   Current Outpatient Rx   Name  Route  Sig  Dispense  Refill  . ibuprofen (ADVIL,MOTRIN) 200 MG tablet   Oral   Take 400 mg by mouth every 6 (six) hours as needed for pain.         Marland Kitchen albuterol (PROVENTIL HFA;VENTOLIN HFA) 108 (90 BASE) MCG/ACT inhaler   Inhalation   Inhale 1-2 puffs into the lungs every 6 (six) hours as needed for wheezing.   1 Inhaler   0   . loratadine (CLARITIN) 10 MG tablet   Oral   Take 1 tablet (10 mg total) by mouth daily.   20 tablet   0   . predniSONE (DELTASONE) 20 MG tablet      2 tabs po daily x 4 days   8 tablet   0    BP 100/54  Pulse 71  Temp(Src) 98.6 F (37 C) (Oral)  Resp 18  SpO2 100%  LMP 03/24/2013 Physical Exam  Constitutional: She is oriented to person, place, and time. She appears well-developed and well-nourished. No distress.  HENT:  Head: Normocephalic and atraumatic.  Mouth/Throat: Oropharynx is clear and moist.  Eyes: Conjunctivae are normal. Pupils are equal, round, and reactive to light.  Neck: Normal range of motion. Neck supple.  Cardiovascular: Normal rate, regular rhythm and intact distal pulses.   Pulmonary/Chest: Effort normal and breath sounds normal. She has no wheezes. She has no rales. She exhibits  no tenderness.  Abdominal: Soft. Bowel sounds are normal. There is no tenderness. There is no rebound and no guarding.  Neurological: She is alert and oriented to person, place, and time.  Skin: Skin is warm.  Psychiatric: She has a normal mood and affect.    ED Course   Procedures (including critical care time)  Labs Reviewed  CBC WITH DIFFERENTIAL - Abnormal; Notable for the following:    RBC 3.77 (*)    Hemoglobin 11.5 (*)    HCT 34.6 (*)    All other components within normal limits  POCT I-STAT, CHEM 8 - Abnormal; Notable for the following:    Glucose, Bld 100 (*)    Hemoglobin 11.9 (*)    HCT 35.0 (*)    All other components within normal limits  POCT I-STAT TROPONIN I   Dg Chest 2 View  03/25/2013   *RADIOLOGY REPORT*   Clinical Data: Shortness of breath and chest pain.  CHEST - 2 VIEW  Comparison: None.  Findings: Mild hyperinflation. The heart size and pulmonary vascularity are normal. The lungs appear clear and expanded without focal air space disease or consolidation. No blunting of the costophrenic angles.  No pneumothorax.  Mediastinal contours appear intact.  IMPRESSION: Mild hyperinflation compatible with history of asthma.  No evidence of active pulmonary disease.   Original Report Authenticated By: Burman Nieves, M.D.   1. Asthma attack   2. Environmental allergies    PERC negative wells 0 MDM   Date: 03/25/2013  Rate: 89  Rhythm: normal sinus rhythm  QRS Axis: normal  Intervals: normal  ST/T Wave abnormalities: normal  Conduction Disutrbances: none  Narrative Interpretation: unremarkable  In the setting of ongoing symptoms constant with > 8 hours duration one negative ekg and troponin excluded acs. Symptoms are consistent with chest tightness due to allergy (dust) induced asthma attack     Ermin Parisien K Naszir Cott-Rasch, MD 03/25/13 9604  Jasmine Awe, MD 03/25/13 [phone]

## 2013-03-25 NOTE — ED Notes (Signed)
Patient informed that a urine sample was needed at this time and pt stated that she was unable to go at the moment and would let me or the tech know when she could go.

## 2013-05-03 ENCOUNTER — Encounter (HOSPITAL_COMMUNITY): Payer: Self-pay | Admitting: *Deleted

## 2013-05-03 ENCOUNTER — Inpatient Hospital Stay (HOSPITAL_COMMUNITY)
Admission: AD | Admit: 2013-05-03 | Discharge: 2013-05-03 | Disposition: A | Discharge status: Home / Self Care | Payer: Self-pay | Source: Ambulatory Visit | Attending: Family Medicine | Admitting: Family Medicine

## 2013-05-03 DIAGNOSIS — Z3202 Encounter for pregnancy test, result negative: Secondary | ICD-10-CM | POA: Insufficient documentation

## 2013-05-03 DIAGNOSIS — R109 Unspecified abdominal pain: Secondary | ICD-10-CM | POA: Insufficient documentation

## 2013-05-03 DIAGNOSIS — R350 Frequency of micturition: Secondary | ICD-10-CM | POA: Insufficient documentation

## 2013-05-03 LAB — URINALYSIS, ROUTINE W REFLEX MICROSCOPIC
Glucose, UA: NEGATIVE mg/dL
Ketones, ur: NEGATIVE mg/dL
Leukocytes, UA: NEGATIVE
Nitrite: NEGATIVE
Protein, ur: NEGATIVE mg/dL
Urobilinogen, UA: 0.2 mg/dL (ref 0.0–1.0)

## 2013-05-03 LAB — WET PREP, GENITAL

## 2013-05-03 LAB — POCT PREGNANCY, URINE: Preg Test, Ur: NEGATIVE

## 2013-05-03 NOTE — MAU Note (Signed)
Pt reports reports cramping for 2 days and frequent urination

## 2013-05-03 NOTE — MAU Note (Signed)
Pt reports lower abd cramping x 2 days, LMP 03/17/2013, pt reports positive home preg test last Friday.

## 2013-05-03 NOTE — MAU Provider Note (Signed)
History     CSN: [id number]  Arrival date and time: 05/03/13 1812   None     Chief Complaint  Patient presents with  . Possible Pregnancy  . Abdominal Pain   HPI This is a 27 y.o. female who presents with c/o cramping for 2 days. Has just gotten married and is trying to get pregnant. States UPTs at home were positive. [facility] so [facility] with Gon[facility]hea and was treated, as was her husband. Denies fever, nausea or vomiting  RN Note: Pt reports reports cramping for 2 days and frequent urination      OB History   Grav Para Term Preterm Abortions TAB SAB Ect Mult Living   1 1 1       1       Past Medical History  Diagnosis Date  . No pertinent past medical history   . Polysubstance abuse   . Mental disorder   . Depression     Past Surgical History  Procedure Laterality Date  . No past surgeries      Family History  Problem Relation Age of Onset  . Diabetes Maternal Grandmother   . Heart disease Neg Hx   . Hypertension Neg Hx   . Stroke Neg Hx     History  Substance Use Topics  . Smoking status: Current Every Day Smoker -- 0.50 packs/day    Types: Cigarettes  . Smokeless tobacco: Not on file  . Alcohol Use: No    Allergies: No Known Allergies  Prescriptions prior to admission  Medication Sig Dispense Refill  . albuterol (PROVENTIL HFA;VENTOLIN HFA) 108 (90 BASE) MCG/ACT inhaler Inhale 1-2 puffs into the lungs every 6 (six) hours as needed for wheezing.  1 Inhaler  0  . ibuprofen (ADVIL,MOTRIN) 200 MG tablet Take 400 mg by mouth every 6 (six) hours as needed for pain.      Marland Kitchen loratadine (CLARITIN) 10 MG tablet Take 1 tablet (10 mg total) by mouth daily.  20 tablet  0    Review of Systems  Constitutional: Negative for fever, chills and malaise/fatigue.  Gastrointestinal: Positive for abdominal pain. Negative for nausea, vomiting, diarrhea and constipation.  Genitourinary: Positive for frequency. Negative for dysuria.   Physical  Exam   Blood pressure 115/87, pulse 87, temperature 98.5 F (36.9 C), temperature source Oral, resp. rate 18, height 5\' 3"  (1.6 m), weight 72.122 kg (159 lb), last menstrual period 03/17/2013, SpO2 100.00%.  Physical Exam  Constitutional: She is oriented to person, place, and time. She appears well-developed and well-nourished. No distress.  Cardiovascular: Normal rate.   Respiratory: Effort normal.  GI: Soft. She exhibits no distension. There is no tenderness. There is no rebound and no guarding.  Genitourinary: Vagina normal and uterus normal. No vaginal discharge found.  Uterus small, nontender Adnexae nontender No CMT No lesions  Musculoskeletal: Normal range of motion.  Neurological: She is alert and oriented to person, place, and time.  Skin: Skin is warm and dry.  Psychiatric: She has a normal mood and affect.    MAU Course  Procedures  MDM Results for orders placed during the hospital encounter of 05/03/13 (from the past 24 hour(s))  URINALYSIS, ROUTINE W REFLEX MICROSCOPIC     Status: None   Collection Time    05/03/13  6:22 PM      Result Value Range   Color, Urine YELLOW  YELLOW   APPearance CLEAR  CLEAR   Specific Gravity, Urine 1.015  1.005 - 1.030   pH 6.0  5.0 - 8.0   Glucose, UA NEGATIVE  NEGATIVE mg/dL   Hgb urine dipstick NEGATIVE  NEGATIVE   Bilirubin Urine NEGATIVE  NEGATIVE   Ketones, ur NEGATIVE  NEGATIVE mg/dL   Protein, ur NEGATIVE  NEGATIVE mg/dL   Urobilinogen, UA 0.2  0.0 - 1.0 mg/dL   Nitrite NEGATIVE  NEGATIVE   Leukocytes, UA NEGATIVE  NEGATIVE  POCT PREGNANCY, URINE     Status: None   Collection Time    05/03/13  6:52 PM      Result Value Range   Preg Test, Ur NEGATIVE  NEGATIVE  HCG, QUANTITATIVE, PREGNANCY     Status: None   Collection Time    05/03/13  7:15 PM      Result Value Range   hCG, Beta Chain, Quant, S <1  <5 mIU/mL     Assessment and Plan  A:  Pelvic cramping with abnormal LMP      Possible premenstrual cramping       History recent GC, treated  P:  Patient left before wet prep came back  [facility] 05/03/2013, 8:30 PM   Wet prep showed few clue cells GC/Chl neg/neg

## 2013-05-05 NOTE — MAU Provider Note (Signed)
Chart reviewed and agree with management and plan.  

## 2013-05-26 ENCOUNTER — Encounter (HOSPITAL_COMMUNITY): Payer: Self-pay

## 2013-05-26 ENCOUNTER — Inpatient Hospital Stay (HOSPITAL_COMMUNITY)
Admission: AD | Admit: 2013-05-26 | Discharge: 2013-05-26 | Discharge status: Against Medical Advice | Payer: Medicaid Other | Source: Ambulatory Visit | Attending: Obstetrics & Gynecology | Admitting: Obstetrics & Gynecology

## 2013-05-26 DIAGNOSIS — O99891 Other specified diseases and conditions complicating pregnancy: Secondary | ICD-10-CM | POA: Insufficient documentation

## 2013-05-26 DIAGNOSIS — R109 Unspecified abdominal pain: Secondary | ICD-10-CM | POA: Insufficient documentation

## 2013-05-26 LAB — URINALYSIS, ROUTINE W REFLEX MICROSCOPIC
Bilirubin Urine: NEGATIVE
Ketones, ur: NEGATIVE mg/dL
Nitrite: NEGATIVE
Protein, ur: NEGATIVE mg/dL
Urobilinogen, UA: 0.2 mg/dL (ref 0.0–1.0)
pH: 6 (ref 5.0–8.0)

## 2013-05-26 NOTE — MAU Note (Signed)
Patient states she has had a positive home pregnancy test. States she has had abdominal cramping for about 3 days. Denies bleeding or discharge.

## 2013-08-03 NOTE — L&D Delivery Note (Signed)
Delivery Note At 10:50 PM a viable female was delivered via Vaginal, Spontaneous Delivery (Presentation: Right Occiput Anterior).  APGAR: 8, 9; weight 7 lb 7.9 oz (3400 g).  Nuchal cord x 1 easily reduced prior to delivery. Placenta status: Intact, Spontaneous.  Cord: 3 vessels.  Anesthesia: Epidural  Episiotomy: None Lacerations: None Est. Blood Loss (mL): 300  Mom to postpartum.  Baby to Couplet care / Skin to Skin.  Bing PlumeGervasi, Kristin E 01/17/2014, 11:18 PM

## 2013-08-03 NOTE — L&D Delivery Note (Signed)
`````  Attestation of Attending Supervision of Advanced Practitioner: Evaluation and management procedures were performed by the PA/NP/CNM/OB Fellow under my supervision/collaboration. Chart reviewed and agree with management and plan.  FERGUSON,JOHN V 01/22/2014 7:59 PM

## 2013-11-15 ENCOUNTER — Inpatient Hospital Stay (HOSPITAL_COMMUNITY)
Admission: AD | Admit: 2013-11-15 | Discharge: 2013-11-15 | Discharge status: Against Medical Advice | Payer: Self-pay | Source: Ambulatory Visit | Attending: Obstetrics & Gynecology | Admitting: Obstetrics & Gynecology

## 2013-11-15 ENCOUNTER — Encounter (HOSPITAL_COMMUNITY): Payer: Self-pay | Admitting: *Deleted

## 2013-11-15 DIAGNOSIS — F3289 Other specified depressive episodes: Secondary | ICD-10-CM | POA: Insufficient documentation

## 2013-11-15 DIAGNOSIS — O99891 Other specified diseases and conditions complicating pregnancy: Secondary | ICD-10-CM | POA: Insufficient documentation

## 2013-11-15 DIAGNOSIS — Z833 Family history of diabetes mellitus: Secondary | ICD-10-CM | POA: Insufficient documentation

## 2013-11-15 DIAGNOSIS — O99330 Smoking (tobacco) complicating pregnancy, unspecified trimester: Secondary | ICD-10-CM | POA: Insufficient documentation

## 2013-11-15 DIAGNOSIS — F329 Major depressive disorder, single episode, unspecified: Secondary | ICD-10-CM | POA: Insufficient documentation

## 2013-11-15 DIAGNOSIS — R109 Unspecified abdominal pain: Secondary | ICD-10-CM | POA: Insufficient documentation

## 2013-11-15 DIAGNOSIS — O9989 Other specified diseases and conditions complicating pregnancy, childbirth and the puerperium: Principal | ICD-10-CM

## 2013-11-15 DIAGNOSIS — O26899 Other specified pregnancy related conditions, unspecified trimester: Secondary | ICD-10-CM

## 2013-11-15 LAB — RAPID URINE DRUG SCREEN, HOSP PERFORMED
Amphetamines: NOT DETECTED
BARBITURATES: NOT DETECTED
BENZODIAZEPINES: NOT DETECTED
Cocaine: NOT DETECTED
Opiates: NOT DETECTED
Tetrahydrocannabinol: NOT DETECTED

## 2013-11-15 LAB — URINALYSIS, ROUTINE W REFLEX MICROSCOPIC
Bilirubin Urine: NEGATIVE
Glucose, UA: NEGATIVE mg/dL
Hgb urine dipstick: NEGATIVE
Ketones, ur: 15 mg/dL — AB
LEUKOCYTES UA: NEGATIVE
NITRITE: NEGATIVE
PH: 7 (ref 5.0–8.0)
Protein, ur: NEGATIVE mg/dL
SPECIFIC GRAVITY, URINE: 1.015 (ref 1.005–1.030)
UROBILINOGEN UA: 0.2 mg/dL (ref 0.0–1.0)

## 2013-11-15 LAB — WET PREP, GENITAL
TRICH WET PREP: NONE SEEN
YEAST WET PREP: NONE SEEN

## 2013-11-15 LAB — OB RESULTS CONSOLE GC/CHLAMYDIA
CHLAMYDIA, DNA PROBE: NEGATIVE
GC PROBE AMP, GENITAL: NEGATIVE

## 2013-11-15 NOTE — MAU Note (Signed)
Pt left the premises after being seen but before test results were back and without being discharged.

## 2013-11-15 NOTE — MAU Note (Addendum)
Just found out she was preg at [facility].  Has been having lower abd pain for the past wk.

## 2013-11-15 NOTE — MAU Provider Note (Signed)
History     CSN: [id number]  Arrival date and time: 11/15/13 1028   First Provider Initiated Contact with Patient 11/15/13 1108      Chief Complaint  Patient presents with  . Abdominal Pain   HPI 28 year old female G3P1001 at 4012w5d per LMP presents to the MAU with complaints of right sided abdominal pain.  Patient reports that she has had right flank/abdominal pain for approximately 1 week.  Pain occurs primarily at night and is described as a dull ache.  It lasts for a few hours and then subsides.  She also describes an associated "thumping" sensation.  It is moderate in severity (6/10).  No associated nausea, vomiting, diarrhea, fever, chills, dysuria.   She is concerned about contractions/preterm labor.  She states that this does not feel like a fetal movement or a muscle cramp.  She reports good fetal movement and denies any vaginal bleeding.  She reports thin, watery discharge.  Past Medical History  Diagnosis Date  . No pertinent past medical history   . Polysubstance abuse   . Mental disorder   . Depression     Past Surgical History  Procedure Laterality Date  . No past surgeries      Family History  Problem Relation Age of Onset  . Diabetes Maternal Grandmother   . Heart disease Neg Hx   . Hypertension Neg Hx   . Stroke Neg Hx     History  Substance Use Topics  . Smoking status: Current Every Day Smoker -- 0.50 packs/day    Types: Cigarettes  . Smokeless tobacco: Not on file  . Alcohol Use: No    Allergies: No Known Allergies  Prescriptions prior to admission  Medication Sig Dispense Refill  . Prenatal Vit-Fe Fumarate-FA (PRENATAL MULTIVITAMIN) TABS tablet Take 1 tablet by mouth daily at 12 noon.      Marland Kitchen. albuterol (PROVENTIL HFA;VENTOLIN HFA) 108 (90 BASE) MCG/ACT inhaler Inhale 1-2 puffs into the lungs every 6 (six) hours as needed for wheezing.  1 Inhaler  0    ROS Per HPI Physical Exam   Blood pressure 127/86, pulse 110, temperature 98.6 F (37  C), temperature source Oral, resp. rate 16, last menstrual period 04/21/2013, SpO2 98.00%, unknown if currently breastfeeding.  Physical Exam General: well appearing but appears anxious.  NAD. Cardiovascular: RRR. 2/6 soft systolic murmur noted. Lungs: CTAB. No rales, rhonchi, or wheezing noted. Abd: gravid; tender to palpation in the right flank. No rebound or guarding.  No RUQ tenderness. Ext: no appreciable lower extremity edema bilaterally Back: No tenderness to palpation. No CVA tenderness. Pelvic Exam:        External: normal female genitalia without lesions or masses        Vagina: copious thin white discharge.        Cervix: normal without lesions or masses        Samples for Wet prep, GC/Chlamydia obtained  FHR: baseline 140, mod variability, 15x15 accels Toco: None   MAU Course  Procedures  MDM  Assessment and Plan  28 year old female G3P1001 at 5812w5d per LMP presents to the MAU with complaints of right sided abdominal pain/flank pain. - Unclear etiology: DDX - contractions (braxton hicks), spasm, secondary to fetal movement - Reassuring fetal tracing; Cervix closed; No contractions on toco. - Patient reassured. - Awaiting Urine studies and wet prep.  Patient signed out to Dr. Reola CalkinsBeck.  Tommie SamsJayce G Cook 11/15/2013, 11:15 AM    Care assumed from Dr. Adriana Simasook.  Called by nursing that pt had left AMA after being told she would not receive an US in the MAU today.  Wet prep reviewed and +for clue cells.  UA neg.  I had evaluated the pt prior to leaving and agree with above documentation by the resident.  She has f/u at the HD already to start care.  Attempted to call pt to let her know her wet prep results but no answer.  Will place in sticky note for future reference that she will need to be treated.  FWB- reassuring for GA.    Vale HavenKeli L Ashawna Hanback, MD

## 2013-11-16 LAB — GC/CHLAMYDIA PROBE AMP
CT Probe RNA: NEGATIVE
GC Probe RNA: NEGATIVE

## 2013-11-20 ENCOUNTER — Other Ambulatory Visit (HOSPITAL_COMMUNITY): Payer: Self-pay | Admitting: Nurse Practitioner

## 2013-11-20 DIAGNOSIS — Z3689 Encounter for other specified antenatal screening: Secondary | ICD-10-CM

## 2013-11-22 ENCOUNTER — Ambulatory Visit (HOSPITAL_COMMUNITY)
Admission: RE | Admit: 2013-11-22 | Discharge: 2013-11-22 | Disposition: A | Discharge status: Home / Self Care | Payer: Medicaid Other | Source: Ambulatory Visit | Attending: Nurse Practitioner | Admitting: Nurse Practitioner

## 2013-11-22 DIAGNOSIS — Z3689 Encounter for other specified antenatal screening: Secondary | ICD-10-CM | POA: Insufficient documentation

## 2014-01-12 ENCOUNTER — Inpatient Hospital Stay (HOSPITAL_COMMUNITY)
Admission: AD | Admit: 2014-01-12 | Discharge: 2014-01-12 | Disposition: A | Discharge status: Home / Self Care | Payer: Medicaid Other | Source: Ambulatory Visit | Attending: Obstetrics & Gynecology | Admitting: Obstetrics & Gynecology

## 2014-01-12 ENCOUNTER — Encounter (HOSPITAL_COMMUNITY): Payer: Self-pay | Admitting: *Deleted

## 2014-01-12 DIAGNOSIS — O479 False labor, unspecified: Secondary | ICD-10-CM | POA: Insufficient documentation

## 2014-01-12 NOTE — Discharge Instructions (Signed)
Fetal Movement Counts °Patient Name: __________________________________________________ Patient Due Date: ____________________ °Performing a fetal movement count is highly recommended in high-risk pregnancies, but it is good for every pregnant woman to do. Your caregiver may ask you to start counting fetal movements at 28 weeks of the pregnancy. Fetal movements often increase: °· After eating a full meal. °· After physical activity. °· After eating or drinking something sweet or cold. °· At rest. °Pay attention to when you feel the baby is most active. This will help you notice a pattern of your baby's sleep and wake cycles and what factors contribute to an increase in fetal movement. It is important to perform a fetal movement count at the same time each day when your baby is normally most active.  °HOW TO COUNT FETAL MOVEMENTS °1. Find a quiet and comfortable area to sit or lie down on your left side. Lying on your left side provides the best blood and oxygen circulation to your baby. °2. Write down the day and time on a sheet of paper or in a journal. °3. Start counting kicks, flutters, swishes, rolls, or jabs in a 2 hour period. You should feel at least 10 movements within 2 hours. °4. If you do not feel 10 movements in 2 hours, wait 2 3 hours and count again. Look for a change in the pattern or not enough counts in 2 hours. °SEEK MEDICAL CARE IF: °· You feel less than 10 counts in 2 hours, tried twice. °· There is no movement in over an hour. °· The pattern is changing or taking longer each day to reach 10 counts in 2 hours. °· You feel the baby is not moving as he or she usually does. °Date: ____________ Movements: ____________ Start time: ____________ Finish time: ____________  °Date: ____________ Movements: ____________ Start time: ____________ Finish time: ____________ °Date: ____________ Movements: ____________ Start time: ____________ Finish time: ____________ °Date: ____________ Movements: ____________  Start time: ____________ Finish time: ____________ °Date: ____________ Movements: ____________ Start time: ____________ Finish time: ____________ °Date: ____________ Movements: ____________ Start time: ____________ Finish time: ____________ °Date: ____________ Movements: ____________ Start time: ____________ Finish time: ____________ °Date: ____________ Movements: ____________ Start time: ____________ Finish time: ____________  °Date: ____________ Movements: ____________ Start time: ____________ Finish time: ____________ °Date: ____________ Movements: ____________ Start time: ____________ Finish time: ____________ °Date: ____________ Movements: ____________ Start time: ____________ Finish time: ____________ °Date: ____________ Movements: ____________ Start time: ____________ Finish time: ____________ °Date: ____________ Movements: ____________ Start time: ____________ Finish time: ____________ °Date: ____________ Movements: ____________ Start time: ____________ Finish time: ____________ °Date: ____________ Movements: ____________ Start time: ____________ Finish time: ____________  °Date: ____________ Movements: ____________ Start time: ____________ Finish time: ____________ °Date: ____________ Movements: ____________ Start time: ____________ Finish time: ____________ °Date: ____________ Movements: ____________ Start time: ____________ Finish time: ____________ °Date: ____________ Movements: ____________ Start time: ____________ Finish time: ____________ °Date: ____________ Movements: ____________ Start time: ____________ Finish time: ____________ °Date: ____________ Movements: ____________ Start time: ____________ Finish time: ____________ °Date: ____________ Movements: ____________ Start time: ____________ Finish time: ____________  °Date: ____________ Movements: ____________ Start time: ____________ Finish time: ____________ °Date: ____________ Movements: ____________ Start time: ____________ Finish time:  ____________ °Date: ____________ Movements: ____________ Start time: ____________ Finish time: ____________ °Date: ____________ Movements: ____________ Start time: ____________ Finish time: ____________ °Date: ____________ Movements: ____________ Start time: ____________ Finish time: ____________ °Date: ____________ Movements: ____________ Start time: ____________ Finish time: ____________ °Date: ____________ Movements: ____________ Start time: ____________ Finish time: ____________  °Date: ____________ Movements: ____________ Start time: ____________ Finish   time: ____________ °Date: ____________ Movements: ____________ Start time: ____________ Finish time: ____________ °Date: ____________ Movements: ____________ Start time: ____________ Finish time: ____________ °Date: ____________ Movements: ____________ Start time: ____________ Finish time: ____________ °Date: ____________ Movements: ____________ Start time: ____________ Finish time: ____________ °Date: ____________ Movements: ____________ Start time: ____________ Finish time: ____________ °Date: ____________ Movements: ____________ Start time: ____________ Finish time: ____________  °Date: ____________ Movements: ____________ Start time: ____________ Finish time: ____________ °Date: ____________ Movements: ____________ Start time: ____________ Finish time: ____________ °Date: ____________ Movements: ____________ Start time: ____________ Finish time: ____________ °Date: ____________ Movements: ____________ Start time: ____________ Finish time: ____________ °Date: ____________ Movements: ____________ Start time: ____________ Finish time: ____________ °Date: ____________ Movements: ____________ Start time: ____________ Finish time: ____________ °Date: ____________ Movements: ____________ Start time: ____________ Finish time: ____________  °Date: ____________ Movements: ____________ Start time: ____________ Finish time: ____________ °Date: ____________ Movements:  ____________ Start time: ____________ Finish time: ____________ °Date: ____________ Movements: ____________ Start time: ____________ Finish time: ____________ °Date: ____________ Movements: ____________ Start time: ____________ Finish time: ____________ °Date: ____________ Movements: ____________ Start time: ____________ Finish time: ____________ °Date: ____________ Movements: ____________ Start time: ____________ Finish time: ____________ °Date: ____________ Movements: ____________ Start time: ____________ Finish time: ____________  °Date: ____________ Movements: ____________ Start time: ____________ Finish time: ____________ °Date: ____________ Movements: ____________ Start time: ____________ Finish time: ____________ °Date: ____________ Movements: ____________ Start time: ____________ Finish time: ____________ °Date: ____________ Movements: ____________ Start time: ____________ Finish time: ____________ °Date: ____________ Movements: ____________ Start time: ____________ Finish time: ____________ °Date: ____________ Movements: ____________ Start time: ____________ Finish time: ____________ °Document Released: 08/19/2006 Document Revised: 07/06/2012 Document Reviewed: 05/16/2012 °ExitCare® Patient Information ©2014 [employer]. ° ° °Braxton Hicks Contractions °Pregnancy is commonly associated with contractions of the uterus throughout the pregnancy. Towards the end of pregnancy (32 to 34 weeks), these contractions (Braxton Hicks) can develop more often and may become more forceful. This is not true labor because these contractions do not result in opening (dilatation) and thinning of the cervix. They are sometimes difficult to tell apart from true labor because these contractions can be forceful and people have different pain tolerances. You should not feel embarrassed if you go to the hospital with false labor. Sometimes, the only way to tell if you are in true labor is for your caregiver to follow the changes  in the cervix. °How to tell the difference between true and false labor: °· False labor. °· The contractions of false labor are usually shorter, irregular and not as hard as those of true labor. °· They are often felt in the front of the lower abdomen and in the groin. °· They may leave with walking around or changing positions while lying down. °· They get weaker and are shorter lasting as time goes on. °· These contractions are usually irregular. °· They do not usually become progressively stronger, regular and closer together as with true labor. °· True labor. °· Contractions in true labor last 30 to 70 seconds, become very regular, usually become more intense, and increase in frequency. °· They do not go away with walking. °· The discomfort is usually felt in the top of the uterus and spreads to the lower abdomen and low back. °· True labor can be determined by your caregiver with an exam. This will show that the cervix is dilating and getting thinner. °If there are no prenatal problems or other health problems associated with the pregnancy, it is completely safe to be sent home with false labor and await the onset of   true labor. °HOME CARE INSTRUCTIONS  °· Keep up with your usual exercises and instructions. °· Take medications as directed. °· Keep your regular prenatal appointment. °· Eat and drink lightly if you think you are going into labor. °· If BH contractions are making you uncomfortable: °· Change your activity position from lying down or resting to walking/walking to resting. °· Sit and rest in a tub of warm water. °· Drink 2 to 3 glasses of water. Dehydration may cause B-H contractions. °· Do slow and deep breathing several times an hour. °SEEK IMMEDIATE MEDICAL CARE IF:  °· Your contractions continue to become stronger, more regular, and closer together. °· You have a gushing, burst or leaking of fluid from the vagina. °· An oral temperature above 102° F (38.9° C) develops. °· You have passage of  blood-tinged mucus. °· You develop vaginal bleeding. °· You develop continuous belly (abdominal) pain. °· You have low back pain that you never had before. °· You feel the baby's head pushing down causing pelvic pressure. °· The baby is not moving as much as it used to. °Document Released: 07/20/2005 Document Revised: 10/12/2011 Document Reviewed: 05/01/2013 °ExitCare® Patient Information ©2014 [employer]. ° ° °

## 2014-01-12 NOTE — MAU Note (Signed)
Uc's q 5 minutes at home and felt mainly in her back. + fm. Denies leaking of fluid or bleeding.

## 2014-01-15 ENCOUNTER — Ambulatory Visit (HOSPITAL_COMMUNITY)
Admission: RE | Admit: 2014-01-15 | Discharge: 2014-01-15 | Disposition: A | Discharge status: Home / Self Care | Payer: Medicaid Other | Source: Ambulatory Visit | Attending: Nurse Practitioner | Admitting: Nurse Practitioner

## 2014-01-15 ENCOUNTER — Inpatient Hospital Stay (HOSPITAL_COMMUNITY): Payer: Medicaid Other

## 2014-01-15 ENCOUNTER — Encounter (HOSPITAL_COMMUNITY): Payer: Self-pay | Admitting: *Deleted

## 2014-01-15 ENCOUNTER — Other Ambulatory Visit (HOSPITAL_COMMUNITY): Payer: Self-pay | Admitting: Nurse Practitioner

## 2014-01-15 ENCOUNTER — Observation Stay (HOSPITAL_COMMUNITY)
Admission: AD | Admit: 2014-01-15 | Discharge: 2014-01-16 | Disposition: A | Discharge status: Home / Self Care | Payer: Medicaid Other | Source: Ambulatory Visit | Attending: Obstetrics & Gynecology | Admitting: Obstetrics & Gynecology

## 2014-01-15 DIAGNOSIS — F489 Nonpsychotic mental disorder, unspecified: Secondary | ICD-10-CM | POA: Diagnosis not present

## 2014-01-15 DIAGNOSIS — F329 Major depressive disorder, single episode, unspecified: Secondary | ICD-10-CM | POA: Insufficient documentation

## 2014-01-15 DIAGNOSIS — O288 Other abnormal findings on antenatal screening of mother: Secondary | ICD-10-CM

## 2014-01-15 DIAGNOSIS — O99330 Smoking (tobacco) complicating pregnancy, unspecified trimester: Secondary | ICD-10-CM | POA: Diagnosis not present

## 2014-01-15 DIAGNOSIS — O289 Unspecified abnormal findings on antenatal screening of mother: Secondary | ICD-10-CM | POA: Insufficient documentation

## 2014-01-15 DIAGNOSIS — F3289 Other specified depressive episodes: Secondary | ICD-10-CM | POA: Diagnosis not present

## 2014-01-15 DIAGNOSIS — Z79899 Other long term (current) drug therapy: Secondary | ICD-10-CM | POA: Diagnosis not present

## 2014-01-15 DIAGNOSIS — O9989 Other specified diseases and conditions complicating pregnancy, childbirth and the puerperium: Principal | ICD-10-CM | POA: Insufficient documentation

## 2014-01-15 DIAGNOSIS — F411 Generalized anxiety disorder: Secondary | ICD-10-CM | POA: Insufficient documentation

## 2014-01-15 DIAGNOSIS — O480 Post-term pregnancy: Secondary | ICD-10-CM

## 2014-01-15 DIAGNOSIS — O99340 Other mental disorders complicating pregnancy, unspecified trimester: Secondary | ICD-10-CM | POA: Insufficient documentation

## 2014-01-15 HISTORY — DX: Anxiety disorder, unspecified: F41.9

## 2014-01-15 MED ORDER — ACETAMINOPHEN 325 MG PO TABS: 650.0000 mg | ORAL_TABLET | ORAL | Status: DC | PRN

## 2014-01-15 MED ORDER — ZOLPIDEM TARTRATE 5 MG PO TABS: 5.0000 mg | ORAL_TABLET | Freq: Every evening | ORAL | Status: DC | PRN

## 2014-01-15 MED ORDER — PRENATAL MULTIVITAMIN CH
1.0000 | ORAL_TABLET | Freq: Every day | ORAL | Status: DC
  Administered 2014-01-16: 1 via ORAL
  Filled 2014-01-15: qty 1

## 2014-01-15 MED ORDER — CALCIUM CARBONATE ANTACID 500 MG PO CHEW: 2.0000 | CHEWABLE_TABLET | ORAL | Status: DC | PRN

## 2014-01-15 MED ORDER — DOCUSATE SODIUM 100 MG PO CAPS
100.0000 mg | ORAL_CAPSULE | Freq: Every day | ORAL | Status: DC
  Administered 2014-01-16: 100 mg via ORAL
  Filled 2014-01-15: qty 1

## 2014-01-15 NOTE — Progress Notes (Addendum)
Patient ID: Gabrielle OvensHeather Baker, female   DOB: 12/08/1985, 28 y.o.   MRN: [id number] I reviewed pts initial sono results.  Her dates appear to be symmetric but, between 36 and 37 weeks.  Recommend repeat BPP in in the am.  Reviewed results with pt.  Of note, pt has a cardiac murmer that she reports that she has had this for years. Continuous monitoring on ante overnight.  Gabrielle Baker, M.D., Evern CoreFACOG

## 2014-01-15 NOTE — H&P (Signed)
LABOR ADMISSION HISTORY AND PHYSICAL  Gabrielle OvensHeather Baker is a 28 y.o. female [phone] with IUP at 452w1d presenting for nonreactive NST and 6/8BPP.  [facility] 32 weeks wks.  There were ~4 visits total.  Her PNC was complicated with polysubstance abuse.  She reports that her THC was laced 'accidentally'  Prenatal History/Complications: Pts prenatal course limited by late care and limited visits.  Past Medical History: Past Medical History  Diagnosis Date  . No pertinent past medical history   . Polysubstance abuse   . Mental disorder   . Depression   . Anxiety     Past Surgical History: Past Surgical History  Procedure Laterality Date  . No past surgeries      Obstetrical History: OB History   Grav Para Term Preterm Abortions TAB SAB Ect Mult Living   5 3 3       3       Gynecological History: n/a Social History: History   Social History  . Marital Status: Married    Spouse Name: N/A    Number of Children: N/A  . Years of Education: N/A   Social History Main Topics  . Smoking status: Current Every Day Smoker -- 0.25 packs/day for 10 years    Types: Cigarettes  . Smokeless tobacco: None  . Alcohol Use: No  . Drug Use: Yes    Special: Marijuana, "Crack" cocaine, Cocaine     Comment: Denies today 10/1  . Sexual Activity: Yes    Birth Control/ Protection: None   Other Topics Concern  . None   Social History Narrative  . None    Family History: Family History  Problem Relation Age of Onset  . Diabetes Maternal Grandmother   . Heart disease Neg Hx   . Hypertension Neg Hx   . Stroke Neg Hx     Allergies: Allergies  Allergen Reactions  . Acetaminophen Diarrhea    Prescriptions prior to admission  Medication Sig Dispense Refill  . Prenatal Vit-Fe Fumarate-FA (PRENATAL MULTIVITAMIN) TABS tablet Take 1 tablet by mouth daily at 12 noon.      Marland Kitchen. albuterol (PROVENTIL HFA;VENTOLIN HFA) 108 (90 BASE) MCG/ACT inhaler Inhale 1-2 puffs into the lungs every 6  (six) hours as needed for wheezing.  1 Inhaler  0     Review of Systems   All systems reviewed and negative except as stated in HPI  Blood pressure 122/99, pulse 86, temperature 98.1 F (36.7 C), temperature source Axillary, resp. rate 18, last menstrual period 04/21/2013, unknown if currently breastfeeding. General appearance: alert and no distress Lungs: clear to auscultation bilaterally Heart: regular rate and rhythm Abdomen: soft, non-tender; bowel sounds normal Pelvic: 2cm/thick/-3 station/vertex    Extremities: Homans sign is negative, no sign of DVT Presentation: cephalic Fetal monitoringBaseline: 130's bpm, Variability: Good {> 6 bpm) and Accelerations: Reactive Uterine activityNone Dilation: 2 Effacement (%): Thick Station: -3 Exam by:: Baker   Prenatal labs: ABO, Rh:  A+ Antibody:  negative Rubella:  Immune RPR:   NR HBsAg:   neg HIV:   done GBS:   positive 1 hr Glucola not done Genetic screening  Not done Anatomy US normal   Prenatal Transfer Tool  Maternal Diabetes: No Genetic Screening: Declined Maternal Ultrasounds/Referrals: Normal Fetal Ultrasounds or other Referrals:  None Maternal Substance Abuse:  Yes:  Type: Marijuana, Cocaine Significant Maternal Medications:  None Significant Maternal Lab Results: Lab values include: Group B Strep positive     No results found for this or  any previous visit (from the past 24 hour(s)).  Assessment: Gabrielle OvensHeather Baker is a 28 y.o. G5P3003 at 3953w1d here for abnormal BPP.  She is 40 1/[redacted] weeks gestation by a 32 weeks sono. Due to late prenatal care I am ordering a repeat sono.  If she has had appropriate interval growth that agrees with her initial  32 week sono, will induce.   #Labor:will induce it sono dates show appropriate growth #Pain: Epidural prn PCN for +GBS  HARRAWAY-SMITH, Gabrielle Pianka 01/15/2014, 10:14 PM

## 2014-01-15 NOTE — Progress Notes (Signed)
Dr. Erin FullingHarraway-Smith notified via phone at [phone] by sonographer Percell BostonHeather Waken about Ms. Ratajczak not showing up for stat add-on BPP/OB Limited appointment.

## 2014-01-16 ENCOUNTER — Encounter (HOSPITAL_COMMUNITY): Payer: Self-pay | Admitting: *Deleted

## 2014-01-16 ENCOUNTER — Observation Stay (HOSPITAL_COMMUNITY): Payer: Medicaid Other

## 2014-01-16 ENCOUNTER — Inpatient Hospital Stay (HOSPITAL_COMMUNITY)
Admission: AD | Admit: 2014-01-16 | Discharge: 2014-01-17 | Disposition: A | Discharge status: Home / Self Care | Payer: Self-pay | Source: Ambulatory Visit | Attending: Obstetrics & Gynecology | Admitting: Obstetrics & Gynecology

## 2014-01-16 DIAGNOSIS — O479 False labor, unspecified: Secondary | ICD-10-CM | POA: Insufficient documentation

## 2014-01-16 DIAGNOSIS — O99330 Smoking (tobacco) complicating pregnancy, unspecified trimester: Secondary | ICD-10-CM | POA: Insufficient documentation

## 2014-01-16 DIAGNOSIS — O289 Unspecified abnormal findings on antenatal screening of mother: Secondary | ICD-10-CM

## 2014-01-16 DIAGNOSIS — O471 False labor at or after 37 completed weeks of gestation: Secondary | ICD-10-CM

## 2014-01-16 DIAGNOSIS — O9989 Other specified diseases and conditions complicating pregnancy, childbirth and the puerperium: Secondary | ICD-10-CM | POA: Diagnosis not present

## 2014-01-16 LAB — HIV ANTIBODY (ROUTINE TESTING W REFLEX): HIV: NONREACTIVE

## 2014-01-16 LAB — RAPID URINE DRUG SCREEN, HOSP PERFORMED
AMPHETAMINES: NOT DETECTED
Barbiturates: NOT DETECTED
Benzodiazepines: NOT DETECTED
Cocaine: NOT DETECTED
OPIATES: NOT DETECTED
Tetrahydrocannabinol: NOT DETECTED

## 2014-01-16 MED ORDER — FENTANYL CITRATE 0.05 MG/ML IJ SOLN: 50.0000 ug | INTRAMUSCULAR | Status: DC | PRN

## 2014-01-16 NOTE — Progress Notes (Signed)
Patient taking herself off the monitor to walk her husband to the bus stop.

## 2014-01-16 NOTE — Progress Notes (Signed)
Patient discharged home.  She refused listening to instructions about when to return and signs and symptoms of labor.

## 2014-01-16 NOTE — Progress Notes (Signed)
Off monitor to go toMFM

## 2014-01-16 NOTE — Progress Notes (Signed)
UR completed 

## 2014-01-16 NOTE — Progress Notes (Signed)
No cervical change

## 2014-01-16 NOTE — Progress Notes (Signed)
Patient states she is in labor Dr Macon LargeAnyanwu at bedside to do vaginal exam.  She will walk and we will recheck her cervix in a couple hours.  If no change she can be discharged home.

## 2014-01-16 NOTE — MAU Provider Note (Signed)
Obstetric Attending MAU Note  Chief Complaint:  Labor Eval    HPI: Gabrielle Baker is a 28 y.o. [id number] at 1848w2d who presents to maternity admissions reporting possible ROM and small amount of bleeding.  Reports painful contractions every 3 minutes. Good fetal movement. Of note, patient was discharged earlier today after being observed for NRNST; she had a BPP of 10/10.  Cervical exam at discharge was 3-4/70/-2/cephalic, there were no signs of labor at discharge.  Pregnancy Course: [facility] Had four visits.  Patient Active Problem List   Diagnosis Date Noted  . Non-stress test nonreactive 01/15/2014  . Major depressive disorder, single episode, severe, specified as with psychotic behavior 02/13/2013  . Cocaine abuse 02/13/2013  . Opioid abuse with opioid-induced mood disorder 02/13/2013    Past Medical History  Diagnosis Date  . No pertinent past medical history   . Polysubstance abuse   . Mental disorder   . Depression   . Anxiety     OB History  Gravida Para Term Preterm AB SAB TAB Ectopic Multiple Living  5 3 3  0 1 1 0 0 0 3    # Outcome Date GA Lbr Len/2nd Weight Sex Delivery Anes PTL Lv  5 CUR           4 TRM 2010        Y  3 TRM 08/02/06    F SVD None N Y  2 TRM 04/25/04 3477w0d   M SVD None  Y  1 SAB              Comments: System Generated. Please review and update pregnancy details.      Past Surgical History  Procedure Laterality Date  . No past surgeries      Family History: Family History  Problem Relation Age of Onset  . Diabetes Maternal Grandmother   . Heart disease Neg Hx   . Hypertension Neg Hx   . Stroke Neg Hx     Social History: History  Substance Use Topics  . Smoking status: Current Every Day Smoker -- 0.25 packs/day for 10 years    Types: Cigarettes  . Smokeless tobacco: Not on file  . Alcohol Use: No    Allergies:  Allergies  Allergen Reactions  . Acetaminophen Diarrhea    Prescriptions prior to admission   Medication Sig Dispense Refill  . Prenatal Vit-Fe Fumarate-FA (PRENATAL MULTIVITAMIN) TABS tablet Take 1 tablet by mouth daily at 12 noon.      Marland Kitchen. albuterol (PROVENTIL HFA;VENTOLIN HFA) 108 (90 BASE) MCG/ACT inhaler Inhale 1-2 puffs into the lungs every 6 (six) hours as needed for wheezing.  1 Inhaler  0    ROS: Pertinent findings in history of present illness.  Physical Exam  Last menstrual period 04/21/2013, unknown if currently breastfeeding. GENERAL: Well-developed, well-nourished female in no acute distress.  HEENT: Normocephalic, atraumatic HEART: Regular rate and regular RESP: Normal effort, no breathing difficult ABDOMEN: Soft, non-tender, gravid appropriate for gestational age EXTREMITIES: Nontender, no edema NEURO: Alert and oriented SPECULUM EXAM: NEFG, physiologic discharge, no blood, cervix clean Dilation: 3.5 Effacement (%): 70 Cervical Position: Middle Station: -2 Presentation: Vertex Exam by:: Anyanwu  FHT:  Baseline 140 , moderate variability, accelerations present, no decelerations Contractions: no contractions on tocometer    MAU Course: 2230  Initial cervix exam is unchanged from discharge examination 3-4/70/-2. +Bloody show and mucus discharge. Will reevaluate in 2 hours.  Patient not currently in active labor. 0030  Unchanged cervix  Assessment: 1. False labor at or after 37 completed weeks of gestation, antepartum     Plan: Discharge home Labor precautions and fetal kick counts reviewed Follow up with OB provider; scheduled for BPP on 01/19/14 and IOL for postdates on 01/21/14.      Medication List         albuterol 108 (90 BASE) MCG/ACT inhaler  Commonly known as:  PROVENTIL HFA;VENTOLIN HFA  Inhale 1-2 puffs into the lungs every 6 (six) hours as needed for wheezing.     prenatal multivitamin Tabs tablet  Take 1 tablet by mouth daily at 12 noon.        Tereso NewcomerUgonna A Anyanwu, MD 01/17/2014 12:39 AM

## 2014-01-16 NOTE — Discharge Summary (Signed)
Antenatal Physician Discharge Summary  Patient ID: Philipp OvensHeather Favata MRN: [id number] DOB/AGE: 28/08/1985 28 y.o.  Admit date: 01/15/2014 Discharge date: 01/16/2014  Admission Diagnoses: IUP at 2685w1d, NRNST, BPP 6/10  Discharge Diagnoses:  IUP at 3819w2d, RNST, BPP 10/10  Prenatal Procedures: NST and ultrasound  Consults: Neonatology, Maternal Fetal Medicine  Significant Diagnostic Studies:  Results for orders placed during the hospital encounter of 01/15/14 (from the past 168 hour(s))  HIV ANTIBODY (ROUTINE TESTING)   Collection Time    01/15/14  6:42 PM      Result Value Ref Range   HIV 1&2 Ab, 4th Generation NONREACTIVE  NONREACTIVE  URINE RAPID DRUG SCREEN (HOSP PERFORMED)   Collection Time    01/15/14 11:10 PM      Result Value Ref Range   Opiates NONE DETECTED  NONE DETECTED   Cocaine NONE DETECTED  NONE DETECTED   Benzodiazepines NONE DETECTED  NONE DETECTED   Amphetamines NONE DETECTED  NONE DETECTED   Tetrahydrocannabinol NONE DETECTED  NONE DETECTED   Barbiturates NONE DETECTED  NONE DETECTED    Treatments: Continuous FHR monitoring  Hospital Course:  This is a 28 y.o. [id number] with IUP at 819w2d admitted for nonreactive postdates NST, BPP 6/10.  Had reactive FHR monitoring overnight.  Repeat BPP was 10/10.  Patient had irregular contractions (less than 4 per hour), cervix was 3/70/-3/vertex; unchanged cervical exam a couple of hours later.  No leaking of fluid and no bleeding. No indication for induction at this moment. She was deemed stable for discharge to home with outpatient follow up. Already scheduled for BPP on 01/19/14 at 1030 and induction at 41 weeks (01/21/14 at 0730). Fetal movement and labor precautions reviewed.  Of note, patient was very upset that she was not being induced at this moment and told me she does not want me to be her physician anymore.  I calmly told her that if she did not want me, she would have to go to another practice which is highly unlikely at  this point.  She was told that our practice takes care of all unassigned obstetric patients.  She then said she will not go unless she has a bus pass; her RN will contact SW to obtain a bus pass for her.     Discharge Condition: Stable  Disposition: 01-Home or Self Care     Medication List         albuterol 108 (90 BASE) MCG/ACT inhaler  Commonly known as:  PROVENTIL HFA;VENTOLIN HFA  Inhale 1-2 puffs into the lungs every 6 (six) hours as needed for wheezing.     prenatal multivitamin Tabs tablet  Take 1 tablet by mouth daily at 12 noon.         SignedTereso Newcomer: ANYANWU,UGONNA A M.D. 01/16/2014, 4:08 PM

## 2014-01-16 NOTE — MAU Note (Signed)
Pt states she has been contracting since yesterday at 0900. Pt states she loss her Mucous Plug

## 2014-01-16 NOTE — Progress Notes (Signed)
Faculty Practice OB/GYN Attending Note  Subjective:  Patient had a BPP of 10/10 today, was admitted for BPP 6/10 last night.  Reports having irregular contractions, and reports that her "mucus plug came out and I go into labor 24 hours after my mucus plug comes out".   FHR reassuring, no LOF or vaginal bleeding. Good FM.    Objective:  Blood pressure 139/83, pulse 70, temperature 97.8 F (36.6 C), temperature source Oral, resp. rate 18, last menstrual period 04/21/2013, unknown if currently breastfeeding. FHT  Baseline 135 bpm, moderate variability, +accelerations, rare mild variable decelerations Toco: irregular Gen: NAD Abdomen: NT gravid fundus, soft Cervix: Dilation: 3 Effacement (%): 70 Cervical Position: Middle Station: -3 Presentation: Vertex Exam by:: Myrick Mcnairy Ext: 2+ DTRs, no edema, no cyanosis, negative Homan's sign  Assessment & Plan:  28 y.o. [id number] at 6778w2d by 32 week scan admitted for BPP 6/10, now 10/10.  Reassuring fetal status.  Patient feels she wants to be evaluated for labor.  Will recheck cervix in a couple of hours, if no change, will discharge to home.  Already scheduled for BPP on 01/19/14 at 1030 and induction at 41 weeks (01/21/14 at 0730).  Patient is agreeable to this plan.   Jaynie CollinsUGONNA  Amrit Erck, MD, FACOG Attending Obstetrician & Gynecologist Faculty Practice, [facility] [place]

## 2014-01-16 NOTE — Progress Notes (Signed)
Patient refused monitoring prior to discharge.

## 2014-01-17 ENCOUNTER — Inpatient Hospital Stay (HOSPITAL_COMMUNITY): Payer: Medicaid Other | Admitting: Anesthesiology

## 2014-01-17 ENCOUNTER — Encounter (HOSPITAL_COMMUNITY): Payer: Self-pay | Admitting: *Deleted

## 2014-01-17 ENCOUNTER — Encounter (HOSPITAL_COMMUNITY): Payer: Self-pay

## 2014-01-17 ENCOUNTER — Telehealth (HOSPITAL_COMMUNITY): Payer: Self-pay | Admitting: *Deleted

## 2014-01-17 ENCOUNTER — Encounter (HOSPITAL_COMMUNITY): Payer: Medicaid Other | Admitting: Anesthesiology

## 2014-01-17 ENCOUNTER — Inpatient Hospital Stay (HOSPITAL_COMMUNITY)
Admission: AD | Admit: 2014-01-17 | Discharge: 2014-01-19 | DRG: 775 | Disposition: A | Discharge status: Home / Self Care | Payer: Medicaid Other | Source: Ambulatory Visit | Attending: Obstetrics and Gynecology | Admitting: Obstetrics and Gynecology

## 2014-01-17 DIAGNOSIS — Z833 Family history of diabetes mellitus: Secondary | ICD-10-CM | POA: Diagnosis not present

## 2014-01-17 DIAGNOSIS — O99892 Other specified diseases and conditions complicating childbirth: Secondary | ICD-10-CM | POA: Diagnosis present

## 2014-01-17 DIAGNOSIS — O479 False labor, unspecified: Secondary | ICD-10-CM | POA: Diagnosis present

## 2014-01-17 DIAGNOSIS — O99344 Other mental disorders complicating childbirth: Secondary | ICD-10-CM | POA: Diagnosis present

## 2014-01-17 DIAGNOSIS — O99334 Smoking (tobacco) complicating childbirth: Secondary | ICD-10-CM | POA: Diagnosis present

## 2014-01-17 DIAGNOSIS — F1410 Cocaine abuse, uncomplicated: Secondary | ICD-10-CM | POA: Diagnosis present

## 2014-01-17 DIAGNOSIS — F341 Dysthymic disorder: Secondary | ICD-10-CM | POA: Diagnosis present

## 2014-01-17 DIAGNOSIS — Z22330 Carrier of Group B streptococcus: Secondary | ICD-10-CM

## 2014-01-17 DIAGNOSIS — O9989 Other specified diseases and conditions complicating pregnancy, childbirth and the puerperium: Secondary | ICD-10-CM

## 2014-01-17 DIAGNOSIS — O0930 Supervision of pregnancy with insufficient antenatal care, unspecified trimester: Secondary | ICD-10-CM

## 2014-01-17 DIAGNOSIS — IMO0001 Reserved for inherently not codable concepts without codable children: Secondary | ICD-10-CM

## 2014-01-17 LAB — RAPID URINE DRUG SCREEN, HOSP PERFORMED
Amphetamines: NOT DETECTED
BENZODIAZEPINES: NOT DETECTED
Barbiturates: NOT DETECTED
Cocaine: NOT DETECTED
OPIATES: NOT DETECTED
Tetrahydrocannabinol: NOT DETECTED

## 2014-01-17 LAB — CBC
HCT: 38 % (ref 36.0–46.0)
HEMOGLOBIN: 12.8 g/dL (ref 12.0–15.0)
MCH: 29.6 pg (ref 26.0–34.0)
MCHC: 33.7 g/dL (ref 30.0–36.0)
MCV: 87.8 fL (ref 78.0–100.0)
Platelets: 191 10*3/uL (ref 150–400)
RBC: 4.33 MIL/uL (ref 3.87–5.11)
RDW: 15.7 % — ABNORMAL HIGH (ref 11.5–15.5)
WBC: 13.8 10*3/uL — ABNORMAL HIGH (ref 4.0–10.5)

## 2014-01-17 MED ORDER — FENTANYL 2.5 MCG/ML BUPIVACAINE 1/10 % EPIDURAL INFUSION (WH - ANES)
INTRAMUSCULAR | Status: DC | PRN
  Administered 2014-01-17: 14 mL/h via EPIDURAL

## 2014-01-17 MED ORDER — ONDANSETRON HCL 4 MG/2ML IJ SOLN: 4.0000 mg | Freq: Four times a day (QID) | INTRAMUSCULAR | Status: DC | PRN

## 2014-01-17 MED ORDER — EPHEDRINE 5 MG/ML INJ
10.0000 mg | INTRAVENOUS | Status: DC | PRN
  Filled 2014-01-17: qty 4
  Filled 2014-01-17: qty 2

## 2014-01-17 MED ORDER — LIDOCAINE HCL (PF) 1 % IJ SOLN
30.0000 mL | INTRAMUSCULAR | Status: DC | PRN
  Filled 2014-01-17: qty 30

## 2014-01-17 MED ORDER — EPHEDRINE 5 MG/ML INJ
10.0000 mg | INTRAVENOUS | Status: DC | PRN
  Filled 2014-01-17: qty 2

## 2014-01-17 MED ORDER — LACTATED RINGERS IV SOLN: 500.0000 mL | INTRAVENOUS | Status: DC | PRN

## 2014-01-17 MED ORDER — IBUPROFEN 600 MG PO TABS
600.0000 mg | ORAL_TABLET | Freq: Four times a day (QID) | ORAL | Status: DC
  Administered 2014-01-18 – 2014-01-19 (×7): 600 mg via ORAL
  Filled 2014-01-17 (×7): qty 1

## 2014-01-17 MED ORDER — OXYCODONE-ACETAMINOPHEN 5-325 MG PO TABS: 1.0000 | ORAL_TABLET | ORAL | Status: DC | PRN

## 2014-01-17 MED ORDER — FENTANYL 2.5 MCG/ML BUPIVACAINE 1/10 % EPIDURAL INFUSION (WH - ANES)
14.0000 mL/h | INTRAMUSCULAR | Status: DC | PRN
  Filled 2014-01-17: qty 125

## 2014-01-17 MED ORDER — OXYTOCIN BOLUS FROM INFUSION
500.0000 mL | INTRAVENOUS | Status: DC
  Administered 2014-01-17: 500 mL via INTRAVENOUS

## 2014-01-17 MED ORDER — LACTATED RINGERS IV SOLN: 500.0000 mL | Freq: Once | INTRAVENOUS | Status: DC

## 2014-01-17 MED ORDER — LACTATED RINGERS IV SOLN
INTRAVENOUS | Status: DC
  Administered 2014-01-17: 20:00:00 via INTRAVENOUS

## 2014-01-17 MED ORDER — FENTANYL CITRATE 0.05 MG/ML IJ SOLN
INTRAMUSCULAR | Status: AC
  Filled 2014-01-17: qty 2

## 2014-01-17 MED ORDER — PHENYLEPHRINE 40 MCG/ML (10ML) SYRINGE FOR IV PUSH (FOR BLOOD PRESSURE SUPPORT)
80.0000 ug | PREFILLED_SYRINGE | INTRAVENOUS | Status: DC | PRN
  Filled 2014-01-17: qty 2

## 2014-01-17 MED ORDER — CITRIC ACID-SODIUM CITRATE 334-500 MG/5ML PO SOLN: 30.0000 mL | ORAL | Status: DC | PRN

## 2014-01-17 MED ORDER — ACETAMINOPHEN 325 MG PO TABS: 650.0000 mg | ORAL_TABLET | ORAL | Status: DC | PRN

## 2014-01-17 MED ORDER — DIPHENHYDRAMINE HCL 50 MG/ML IJ SOLN: 12.5000 mg | INTRAMUSCULAR | Status: DC | PRN

## 2014-01-17 MED ORDER — OXYTOCIN 40 UNITS IN LACTATED RINGERS INFUSION - SIMPLE MED
62.5000 mL/h | INTRAVENOUS | Status: DC
  Administered 2014-01-17: 62.5 mL/h via INTRAVENOUS
  Filled 2014-01-17: qty 1000

## 2014-01-17 MED ORDER — FLEET ENEMA 7-19 GM/118ML RE ENEM: 1.0000 | ENEMA | Freq: Every day | RECTAL | Status: DC | PRN

## 2014-01-17 MED ORDER — LIDOCAINE HCL (PF) 1 % IJ SOLN
INTRAMUSCULAR | Status: DC | PRN
  Administered 2014-01-17: 5 mL
  Administered 2014-01-17: 3 mL
  Administered 2014-01-17: 5 mL

## 2014-01-17 MED ORDER — SODIUM CHLORIDE 0.9 % IV SOLN
2.0000 g | Freq: Once | INTRAVENOUS | Status: AC
  Administered 2014-01-17: 2 g via INTRAVENOUS
  Filled 2014-01-17: qty 2000

## 2014-01-17 MED ORDER — FENTANYL CITRATE 0.05 MG/ML IJ SOLN
100.0000 ug | Freq: Once | INTRAMUSCULAR | Status: AC
  Administered 2014-01-17: 100 ug via INTRAVENOUS

## 2014-01-17 MED ORDER — IBUPROFEN 600 MG PO TABS: 600.0000 mg | ORAL_TABLET | Freq: Four times a day (QID) | ORAL | Status: DC | PRN

## 2014-01-17 MED ORDER — PHENYLEPHRINE 40 MCG/ML (10ML) SYRINGE FOR IV PUSH (FOR BLOOD PRESSURE SUPPORT)
80.0000 ug | PREFILLED_SYRINGE | INTRAVENOUS | Status: DC | PRN
  Filled 2014-01-17: qty 10
  Filled 2014-01-17: qty 2

## 2014-01-17 NOTE — Discharge Instructions (Signed)
Braxton Hicks Contractions °Contractions of the uterus can occur throughout pregnancy. Contractions are not always a sign that you are in labor.  °WHAT ARE BRAXTON HICKS CONTRACTIONS?  °Contractions that occur before labor are called Braxton Hicks contractions, or false labor. Toward the end of pregnancy (32-34 weeks), these contractions can develop more often and may become more forceful. This is not true labor because these contractions do not result in opening (dilatation) and thinning of the cervix. They are sometimes difficult to tell apart from true labor because these contractions can be forceful and people have different pain tolerances. You should not feel embarrassed if you go to the hospital with false labor. Sometimes, the only way to tell if you are in true labor is for your health care provider to look for changes in the cervix. °If there are no prenatal problems or other health problems associated with the pregnancy, it is completely safe to be sent home with false labor and await the onset of true labor. °HOW CAN YOU TELL THE DIFFERENCE BETWEEN TRUE AND FALSE LABOR? °False Labor °· The contractions of false labor are usually shorter and not as hard as those of true labor.   °· The contractions are usually irregular.   °· The contractions are often felt in the front of the lower abdomen and in the groin.   °· The contractions may go away when you walk around or change positions while lying down.   °· The contractions get weaker and are shorter lasting as time goes on.   °· The contractions do not usually become progressively stronger, regular, and closer together as with true labor.   °True Labor °· Contractions in true labor last 30-70 seconds, become very regular, usually become more intense, and increase in frequency.   °· The contractions do not go away with walking.   °· The discomfort is usually felt in the top of the uterus and spreads to the lower abdomen and low back.   °· True labor can be  determined by your health care provider with an exam. This will show that the cervix is dilating and getting thinner.   °WHAT TO REMEMBER °· Keep up with your usual exercises and follow other instructions given by your health care provider.   °· Take medicines as directed by your health care provider.   °· Keep your regular prenatal appointments.   °· Eat and drink lightly if you think you are going into labor.   °· If Braxton Hicks contractions are making you uncomfortable:   °¨ Change your position from lying down or resting to walking, or from walking to resting.   °¨ Sit and rest in a tub of warm water.   °¨ Drink 2-3 glasses of water. Dehydration may cause these contractions.   °¨ Do slow and deep breathing several times an hour.   °WHEN SHOULD I SEEK IMMEDIATE MEDICAL CARE? °Seek immediate medical care if: °· Your contractions become stronger, more regular, and closer together.   °· You have fluid leaking or gushing from your vagina.   °· You have a fever.   °· You pass blood-tinged mucus.   °· You have vaginal bleeding.   °· You have continuous abdominal pain.   °· You have low back pain that you never had before.   °· You feel your baby's head pushing down and causing pelvic pressure.   °· Your baby is not moving as much as it used to.   °Document Released: 07/20/2005 Document Revised: 07/25/2013 Document Reviewed: 05/01/2013 °ExitCare® Patient Information ©2015 [employer]. This information is not intended to replace advice given to you by your health care   provider. Make sure you discuss any questions you have with your health care provider. ° °

## 2014-01-17 NOTE — Progress Notes (Signed)
Philipp OvensHeather Much is a 28 y.o. [phone] at 2633w3d   Subjective: Comfortable with epidural  Objective: BP 125/80  Pulse 97  Temp(Src) 97.6 F (36.4 C) (Axillary)  Resp 18  Ht 5\' 4"  (1.626 m)  Wt 71.668 kg (158 lb)  BMI 27.11 kg/m2  SpO2 86%  LMP 04/21/2013      FHT:  FHR: 120s bpm, variability: moderate,  accelerations:  Present,  decelerations:  Absent UC:   regular, every 2-4 minutes SVE:   Dilation: 8 Effacement (%): 90 Station: 0 Exam by:: Beryle FlockGervasi PA Student AROM for sm clear/blood-tinged fluid  Labs: Lab Results  Component Value Date   WBC 13.8* 01/17/2014   HGB 12.8 01/17/2014   HCT 38.0 01/17/2014   MCV 87.8 01/17/2014   PLT 191 01/17/2014    Assessment / Plan: IUP at term Active labor/transition  The plan is for the AROM to continue the progression of labor Anticipate SVD soon  Cam HaiSHAW, KIMBERLY CNM 01/17/2014, 10:15 PM

## 2014-01-17 NOTE — Telephone Encounter (Signed)
Preadmission screen  

## 2014-01-17 NOTE — H&P (Signed)
LABOR ADMISSION HISTORY AND PHYSICAL  Gabrielle OvensHeather Baker is a 28 y.o. female [phone] with IUP at 2234w3d presenting for active labor.  Felt having contractions since last night.   Reports to have LOF and some bleeding. Evaluated last night in the MAU and found to have bloody show. Upon cervical exam upon admission was found to be intact.   History of bipolar, depression and anxiety. Not currently on any medications.   UDS during pregnancy postive for cocaine and smoking during pregnancy. [facility]   Prenatal History/Complications:  Past Medical History: Past Medical History  Diagnosis Date  . No pertinent past medical history   . Polysubstance abuse   . Mental disorder   . Depression   . Anxiety     Past Surgical History: Past Surgical History  Procedure Laterality Date  . No past surgeries      Obstetrical History: OB History   Grav Para Term Preterm Abortions TAB SAB Ect Mult Living   5 3 3  0 1 0 1 0 0 3    Largest baby: 7lbs  All vaginal   Social History: History   Social History  . Marital Status: Married    Spouse Name: N/A    Number of Children: N/A  . Years of Education: N/A   Social History Main Topics  . Smoking status: Current Every Day Smoker -- 0.25 packs/day for 10 years    Types: Cigarettes  . Smokeless tobacco: None  . Alcohol Use: No  . Drug Use: Yes    Special: Marijuana, "Crack" cocaine, Cocaine     Comment: Denies today 10/1  . Sexual Activity: Yes    Birth Control/ Protection: None   Other Topics Concern  . None   Social History Narrative  . None    Family History: Family History  Problem Relation Age of Onset  . Diabetes Maternal Grandmother   . Heart disease Neg Hx   . Hypertension Neg Hx   . Stroke Neg Hx     Allergies: Allergies  Allergen Reactions  . Acetaminophen Diarrhea    Prescriptions prior to admission  Medication Sig Dispense Refill  . albuterol  (PROVENTIL HFA;VENTOLIN HFA) 108 (90 BASE) MCG/ACT inhaler Inhale 1-2 puffs into the lungs every 6 (six) hours as needed for wheezing.  1 Inhaler  0  . Prenatal Vit-Fe Fumarate-FA (PRENATAL MULTIVITAMIN) TABS tablet Take 1 tablet by mouth daily at 12 noon.         Review of Systems   All systems reviewed and negative except as stated in HPI  Last menstrual period 04/21/2013, unknown if currently breastfeeding. General appearance: alert, cooperative and moderate distress Abdomen: soft, non-tender; bowel sounds normal Extremities: Homans sign is negative, no sign of DVT Presentation: cephalic Fetal monitoringBaseline: 145 bpm, Variability: Fair (1-6 bpm), Accelerations: Reactive and Decelerations: Absent Uterine activity: Every 5 minutes.     Prenatal labs: ABO, Rh:   Antibody:   Rubella:   RPR:    HBsAg:    HIV: NONREACTIVE (06/15 1842)  GBS:    1 hr Glucola 115 Genetic screening  Too late  Anatomy US Normal    Prenatal Transfer Tool  Maternal Diabetes: No Genetic Screening: Care too late.  Maternal Ultrasounds/Referrals: Normal Fetal Ultrasounds or other Referrals:  None Maternal Substance Abuse:  Yes:  Type: Smoker, Cocaine, 3rd trimester UDS: normal  Significant Maternal Medications:  None Significant Maternal Lab Results: Lab values include: Group B Strep  positive   No results found for this or any previous visit (from the past 24 hour(s)).  Assessment: Gabrielle Baker is a 28 y.o. [phone] at 6317w3d here for active labor.    #Labor: progressing spontaneously  #Pain: Epidural  #FWB: Cat II #ID:  GBS +, Amp #MOF: Breast  #MOC:nexplanon  #Circ:  Female   Myra RudeSchmitz, Jeremy E 01/17/2014, 7:25 PM   I have seen and examined this patient and agree with above documentation in the resident's note. Pt is dated by 32 week [place] and has had very limited care throughout pregnancy. Was admitted for overnight obs on 6/15 for BPP of 6/10 and US at that time showing a fetus that  was approx [redacted]weeks gestation at that time. Now in active labor.  FWB- cat II  Rulon AbideKeli Beck, M.D. [facility] Fellow 01/17/2014 9:54 PM

## 2014-01-17 NOTE — Anesthesia Preprocedure Evaluation (Signed)
Anesthesia Evaluation  Patient identified by MRN, date of birth, ID band Patient awake    Reviewed: Allergy & Precautions, H&P , NPO status , Patient's Chart, lab work & pertinent test results  History of Anesthesia Complications Negative for: history of anesthetic complications  Airway Mallampati: II TM Distance: >3 FB Neck ROM: full    Dental no notable dental hx. (+) Teeth Intact   Pulmonary neg pulmonary ROS, Current Smoker,  breath sounds clear to auscultation  Pulmonary exam normal       Cardiovascular negative cardio ROS  Rhythm:regular Rate:Normal     Neuro/Psych negative neurological ROS  negative psych ROS   GI/Hepatic negative GI ROS, Neg liver ROS, (+)     substance abuse  cocaine use,   Endo/Other  negative endocrine ROS  Renal/GU negative Renal ROS  negative genitourinary   Musculoskeletal   Abdominal Normal abdominal exam  (+)   Peds  Hematology negative hematology ROS (+)   Anesthesia Other Findings   Reproductive/Obstetrics (+) Pregnancy                           Anesthesia Physical Anesthesia Plan  ASA: III  Anesthesia Plan: Epidural   Post-op Pain Management:    Induction:   Airway Management Planned:   Additional Equipment:   Intra-op Plan:   Post-operative Plan:   Informed Consent: I have reviewed the patients History and Physical, chart, labs and discussed the procedure including the risks, benefits and alternatives for the proposed anesthesia with the patient or authorized representative who has indicated his/her understanding and acceptance.     Plan Discussed with:   Anesthesia Plan Comments:         Anesthesia Quick Evaluation

## 2014-01-17 NOTE — Anesthesia Procedure Notes (Signed)
Epidural Patient location during procedure: OB  Staffing Anesthesiologist: Phillips GroutARIGNAN, PETER Performed by: anesthesiologist   Preanesthetic Checklist Completed: patient identified, site marked, surgical consent, pre-op evaluation, timeout performed, IV checked, risks and benefits discussed and monitors and equipment checked  Epidural Patient position: sitting Prep: ChloraPrep Patient monitoring: heart rate, continuous pulse ox and blood pressure Approach: right paramedian Location: L3-L4 Injection technique: LOR saline  Needle:  Needle type: Hustead  Needle gauge: 18 G Needle length: 9 cm and 9 Needle insertion depth: 5 cm Catheter type: closed end flexible Catheter size: 20 Guage Catheter at skin depth: 10 cm Test dose: negative and 1.5% lidocaine  Assessment Events: blood not aspirated, injection not painful, no injection resistance, negative IV test and no paresthesia  Additional Notes Test dose 1.5% Lidocaine with epi 1:200,000  Patient tolerated the insertion well without complications.

## 2014-01-17 NOTE — H&P (Signed)
Attestation of Attending Supervision of Advanced Practitioner: Evaluation and management procedures were performed by the PA/NP/CNM/OB Fellow under my supervision/collaboration. Chart reviewed and agree with management and plan.  FERGUSON,JOHN V 01/17/2014 11:37 PM   

## 2014-01-18 LAB — RPR

## 2014-01-18 MED ORDER — DIPHENHYDRAMINE HCL 25 MG PO CAPS
25.0000 mg | ORAL_CAPSULE | Freq: Four times a day (QID) | ORAL | Status: DC | PRN
  Administered 2014-01-18: 25 mg via ORAL
  Filled 2014-01-18: qty 1

## 2014-01-18 MED ORDER — PNEUMOCOCCAL VAC POLYVALENT 25 MCG/0.5ML IJ INJ
0.5000 mL | INJECTION | Freq: Once | INTRAMUSCULAR | Status: AC
  Administered 2014-01-18: 0.5 mL via INTRAMUSCULAR
  Filled 2014-01-18 (×2): qty 0.5

## 2014-01-18 MED ORDER — ZOLPIDEM TARTRATE 5 MG PO TABS: 5.0000 mg | ORAL_TABLET | Freq: Every evening | ORAL | Status: DC | PRN

## 2014-01-18 MED ORDER — ONDANSETRON HCL 4 MG/2ML IJ SOLN: 4.0000 mg | INTRAMUSCULAR | Status: DC | PRN

## 2014-01-18 MED ORDER — WITCH HAZEL-GLYCERIN EX PADS: 1.0000 "application " | MEDICATED_PAD | CUTANEOUS | Status: DC | PRN

## 2014-01-18 MED ORDER — PRENATAL MULTIVITAMIN CH
1.0000 | ORAL_TABLET | Freq: Every day | ORAL | Status: DC
  Administered 2014-01-18 – 2014-01-19 (×2): 1 via ORAL
  Filled 2014-01-18 (×2): qty 1

## 2014-01-18 MED ORDER — SENNOSIDES-DOCUSATE SODIUM 8.6-50 MG PO TABS
2.0000 | ORAL_TABLET | ORAL | Status: DC
  Administered 2014-01-18: 2 via ORAL
  Filled 2014-01-18: qty 2

## 2014-01-18 MED ORDER — ONDANSETRON HCL 4 MG PO TABS: 4.0000 mg | ORAL_TABLET | ORAL | Status: DC | PRN

## 2014-01-18 MED ORDER — OXYCODONE-ACETAMINOPHEN 5-325 MG PO TABS
1.0000 | ORAL_TABLET | ORAL | Status: DC | PRN
  Administered 2014-01-18 – 2014-01-19 (×6): 1 via ORAL
  Filled 2014-01-18 (×6): qty 1

## 2014-01-18 MED ORDER — DIBUCAINE 1 % RE OINT: 1.0000 "application " | TOPICAL_OINTMENT | RECTAL | Status: DC | PRN

## 2014-01-18 MED ORDER — SIMETHICONE 80 MG PO CHEW: 80.0000 mg | CHEWABLE_TABLET | ORAL | Status: DC | PRN

## 2014-01-18 MED ORDER — BENZOCAINE-MENTHOL 20-0.5 % EX AERO
1.0000 "application " | INHALATION_SPRAY | CUTANEOUS | Status: DC | PRN
  Filled 2014-01-18: qty 56

## 2014-01-18 MED ORDER — TETANUS-DIPHTH-ACELL PERTUSSIS 5-2.5-18.5 LF-MCG/0.5 IM SUSP: 0.5000 mL | Freq: Once | INTRAMUSCULAR | Status: DC

## 2014-01-18 MED ORDER — LANOLIN HYDROUS EX OINT: TOPICAL_OINTMENT | CUTANEOUS | Status: DC | PRN

## 2014-01-18 NOTE — Progress Notes (Addendum)
Patient told me that she would wants to go outside to smoke. I explained to patient that it would not be allowed for her to go outside to smoke at this time. I explained to her that it would be best if she stayed in the hospital to be monitored for bleeding and also that she had just had her epidural removed. Charge RN Emilio MathLeanne Carpenter notified house coverage and also called Dr Emelda FearFerguson who came to the bedside. Dr Emelda FearFerguson spoke with the father of baby and the patient to make sure that the baby safe before going outside. Dr Emelda FearFerguson assisted the patient, FOB, and I to room 119 via wheelchair. New RN aware.

## 2014-01-18 NOTE — Anesthesia Postprocedure Evaluation (Signed)
  Anesthesia Post-op Note  Anesthesia Post Note  Patient: Gabrielle OvensHeather Baker  Procedure(s) Performed: * No procedures listed *  Anesthesia type: Epidural  Patient location: Mother/Baby  Post pain: Pain level controlled  Post assessment: Post-op Vital signs reviewed  Last Vitals:  Filed Vitals:   01/18/14 0520  BP: 119/59  Pulse: 83  Temp: 36.7 C  Resp: 18    Post vital signs: Reviewed  Level of consciousness:alert  Complications: No apparent anesthesia complications

## 2014-01-18 NOTE — Progress Notes (Signed)
Post Partum Day 1 Subjective: no complaints, up ad lib, voiding, tolerating PO and + flatus  Objective: Blood pressure 119/59, pulse 83, temperature 98 F (36.7 C), temperature source Oral, resp. rate 18, height 5\' 4"  (1.626 m), weight 71.668 kg (158 lb), last menstrual period 04/21/2013, SpO2 86.00%, unknown if currently breastfeeding.  Physical Exam:  General: alert, cooperative and no distress Lochia: appropriate Uterine Fundus: firm Incision: N/A DVT Evaluation: No evidence of DVT seen on physical exam. No cords or calf tenderness. No significant calf/ankle edema.   Recent Labs  01/17/14 1910  HGB 12.8  HCT 38.0    Assessment/Plan: Pt doing well today Breastfeeding/bottle MOC: Nexplanon    LOS: 1 day   Bing PlumeGervasi, Kristin E 01/18/2014, 8:00 AM   I have seen and examined this patient and I agree with the above. Cam HaiSHAW, KIMBERLY CNM 8:54 AM 01/18/2014

## 2014-01-18 NOTE — Progress Notes (Signed)
Clinical Social Work Department PSYCHOSOCIAL ASSESSMENT - MATERNAL/CHILD 01/18/2014  Patient:  Gabrielle Baker, Gabrielle Baker  Account Number:  [id number]  [place] Date:  01/17/2014  Ardine Eng Name:   Roney Mans    Clinical Social Worker:  Terri Piedra, LCSW   Date/Time:  01/18/2014 11:00 AM  Date Referred:  01/18/2014   Referral source  CN  Physician     Referred reason  Substance Abuse  Behavioral Health Issues   Other referral source:    I:  FAMILY / HOME ENVIRONMENT Child's legal guardian:  PARENT  Guardian - Name Guardian - Age Guardian - Address  Gabrielle Baker [address], [place], [place] [address]  [name] 33 same   Other household support members/support persons Name Relationship DOB  [name] OTHER    Other support:   MOB states she has a great support system.  She states she and FOB have been together for 3 years and will celebrate their one year wedding anniversary on 8/18.  They live with FOB's grandmother.  MOB reports her mother, grandmother, brothers and aunts are also great supports, as well as friends in the area.    II  PSYCHOSOCIAL DATA Information Source:  Patient Interview  Occupational hygienist Employment:   MOB works part time at [employer]:   If Medicaid - County:    School / Grade:  FOB attends [employer] for [employer] / Child Services Coordination / Early Interventions:   [place]  Cultural issues impacting care:   none stated    III  STRENGTHS Strengths  Adequate Resources  Home prepared for Child (including basic supplies)  Other - See comment  Supportive family/friends   Strength comment:  MOB states she will get a pediatrician list and chose one as soon as possible.  She states she initially chose [facility] at [facility] from a list given to her from the HD, but was then told they do not accept new Medicaid patients.   IV  RISK FACTORS AND CURRENT PROBLEMS Current Problem:   YES   Risk Factor & Current Problem Patient Issue Family Issue Risk Factor / Current Problem Comment  Substance Abuse Y N Hx of Cocaine and THC  Mental Illness Y N Bipolar/Anxiety/[facility] admission 7/14         V  SOCIAL WORK ASSESSMENT  CSW met with MOB in her first floor room to complete assessment due to hx of substance about and dx of Bipolar and Anxiety.  MOB was incredibly pleasant and welcoming of CSW's visit.  She states she and baby are doing well and seems very excited about the new baby.  She proudly showed baby to Erwin and told her baby's name is [name].  CSW explained role in hospital and asked if we could speak at this time.  MOB agreed and was very engaged and seemed forthcoming with information.  CSW asked about her mental health diagnoses and MOB states she plans to resume her medication.  She reports taking Lamictal, Zoloft and Xanax prior to pregnancy and stopping medication due to pregnancy.  She states she does not want to take a benzodiazepine at this time, so her doctor plans to change her from Xanax to Buspar, but states there is a reaction between Zoloft and Buspar, so she will be changing antidepressant medications as well.  MOB states she sees a Teacher, music at [employer].  She states she also has a Transport planner at [employer].  CSW asked who  her therapist is and if she has a therapeutic relationship with this person and MOB stated she didn't know who her therapist is.  CSW questioned this and MOB states she had "taken a break" from going while she was pregnant.  CSW stated understanding of her break from medication, but does not understand a break from counseling or why she did not know who her counselor is.  She states she has been talking to her pastor at church.  She states she has a counseling appointment "soon," but is unsure when.  MOB attributes her mental illness to things she went through as a child.  CSW explained that having mental illnesses is out of her control and  not her fault, but that caring for and seeking treatment for them is her responsibility.  CSW has reviewed documentation from [facility] admission 1 year ago and is highly concerned about MOB's mental illness as well as substance abuse.  CSW is concerned that MOB has not been in counseling since her discharge from [facility].  CSW asked MOB about drug use and she states she "occasionally" smoked marijuana, but that she does not any more.  CSW asked her when her last use was and she estimates that it was early April 2015.  She added that both she and baby are "clean."  CSW confirmed that both UDSs are negative, but that we also test the baby's meconium.  MOB asked what will happen if that is positive. CSW explained hospital drug screen policy to explain why we are testing baby and mandated reporting to CPS for baby's who have positive screens.  CSW asked about the note in MOB's PNR from the Health Department that she was positive for Cocaine (only) on 11/20/13.  MOB states the marijuana she smoked in April was not hers and that it must have been laced with Cocaine.  CSW asked if MOB has ever used Cocaine.  (Per note in MOB's medical record, MOB told [facility] psychiatrist that she has been smoking crack since her mother introduced her to it at age 21).  MOB states she has used it once in a while, but that it "knocks me off my rocker with my other meds," so she no longer uses.  She denies need for substance abuse treatment.  CSW asked about her other children.  She states they do not live with her.  CSW asked where they live and if CPS was involved with their placement out of the home.  She stated honestly that CPS was involved and that her oldest two children (ages 60 and 68) live with her grandmother and her 79 year old lives with his PGM.  MOB stated that she did not want to talk about this because this (having a baby) is a happy time and she does not want to talk about things (the situation with her other children) that are  depressing.  CSW validated her feelings and explained the reason CSW was inquiring.  MOB stated understanding that CSW was "just doing your job," and agreed to talk "briefly."  She states she did not want to be a mother when the first two children were born and that her grandmother got them as infants.  She also stated that she was having mental health issues.  She states that she and the FOB of her 28 year old fought and this led to the child being placed with PGM.  CSW did not ask about plans for reunification with this child.  CSW explained  need to make a report given recent drug use and no custody of her other children.  CSW explained that this is not punishment, but to ensure her safety and baby's safety.  CSW explained CPS as a resource for her, if they accept the report.  MOB states she and FOB (this is their first child together, although he has another child who does not live with them) are fine and that she is not concerned about a call to CPS.  CSW made report to [facility].  The case was accepted and assigned to ReAndra Waddell/[id number].  CSW has spoken with Ms. Waddell who plans to come to the hospital at [facility] on 01/19/14 to initiate the case with MOB.     VI SOCIAL WORK PLAN Social Work Secretary/administrator Education  Child Scientist, forensic Report  Information/Referral to [employer]   Type of pt/family education:   Hospital drug screen policy  CPS report made  Importance of mental health care   If child protective services report - county: [place]  If child protective services report - date: 01/18/14 Information/referral to community resources comment:   [place] referral   Other social work plan:   CSW will follow up with CPS worker  CSW will monitor MDS result

## 2014-01-18 NOTE — Progress Notes (Signed)
Upon initial assessment of patient, patient demanded to go outside and smoke. Dr. Emelda FearFerguson had told patient that this was okay. I told patient I would feel more comfortable if her husband went with her to smoke and took a wheelchair just in case she felt dizzy or weak. Patient adamantly refused because baby would have to be taken to nursery while they went to smoke. I then asked if she would be okay with me going with her the first time with wheelchair just in case she felt dizzy or nauseous. Pt agreed to this. 5 minutes after leaving the room, Patient was seen by another staff member "rushing down hall". Patient went to smoke alone without a wheelchair and alone against all advice and against our agreement.

## 2014-01-18 NOTE — Progress Notes (Signed)
UR chart review completed.  

## 2014-01-18 NOTE — Progress Notes (Signed)
LD charge RN called to room 167 to talk to pt. Patient refusing care for post delivery. Demanding to leave the room and go outside to smoke. House coverage called, referred to call Dr. Emelda FearFerguson. Dr. Emelda FearFerguson called to bedside to talk to pt and FOB. Pt very agitated that she was not able to get up and go straight out and smoke. Dr. Emelda FearFerguson discussed with pt and husband that she need to make sure the baby was safe before leaving the room and going outside. Dr. Emelda FearFerguson assisted the RN and pt to the new room.

## 2014-01-19 ENCOUNTER — Ambulatory Visit (HOSPITAL_COMMUNITY): Payer: Self-pay

## 2014-01-19 MED ORDER — IBUPROFEN 600 MG PO TABS: 600.0000 mg | ORAL_TABLET | Freq: Four times a day (QID) | ORAL | Status: DC

## 2014-01-19 NOTE — Discharge Instructions (Signed)
Vaginal Delivery °Care After °Refer to this sheet in the next few weeks. These discharge instructions provide you with information on caring for yourself after delivery. Your caregiver may also give you specific instructions. Your treatment has been planned according to the most current medical practices available, but problems sometimes occur. Call your caregiver if you have any problems or questions after you go home. °HOME CARE INSTRUCTIONS °· Take over-the-counter or prescription medicines only as directed by your caregiver or pharmacist. °· Do not drink alcohol, especially if you are breastfeeding or taking medicine to relieve pain. °· Do not chew or smoke tobacco. °· Do not use illegal drugs. °· Continue to use good perineal care. Good perineal care includes: °¨ Wiping your perineum from front to back. °¨ Keeping your perineum clean. °· Do not use tampons or douche until your caregiver says it is okay. °· Shower, wash your hair, and take tub baths as directed by your caregiver. °· Wear a well-fitting bra that provides breast support. °· Eat healthy foods. °· Drink enough fluids to keep your urine clear or pale yellow. °· Eat high-fiber foods such as whole grain cereals and breads, brown rice, beans, and fresh fruits and vegetables every day. These foods may help prevent or relieve constipation. °· Follow your cargiver's recommendations regarding resumption of activities such as climbing stairs, driving, lifting, exercising, or traveling. °· Talk to your caregiver about resuming sexual activities. Resumption of sexual activities is dependent upon your risk of infection, your rate of healing, and your comfort and desire to resume sexual activity. °· Try to have someone help you with your household activities and your newborn for at least a few days after you leave the hospital. °· Rest as much as possible. Try to rest or take a nap when your newborn is sleeping. °· Increase your activities gradually. °· Keep all  of your scheduled postpartum appointments. It is very important to keep your scheduled follow-up appointments. At these appointments, your caregiver will be checking to make sure that you are healing physically and emotionally. °SEEK MEDICAL CARE IF:  °· You are passing large clots from your vagina. Save any clots to show your caregiver. °· You have a foul smelling discharge from your vagina. °· You have trouble urinating. °· You are urinating frequently. °· You have pain when you urinate. °· You have a change in your bowel movements. °· You have increasing redness, pain, or swelling near your vaginal incision (episiotomy) or vaginal tear. °· You have pus draining from your episiotomy or vaginal tear. °· Your episiotomy or vaginal tear is separating. °· You have painful, hard, or reddened breasts. °· You have a severe headache. °· You have blurred vision or see spots. °· You feel sad or depressed. °· You have thoughts of hurting yourself or your newborn. °· You have questions about your care, the care of your newborn, or medicines. °· You are dizzy or lightheaded. °· You have a rash. °· You have nausea or vomiting. °· You were breastfeeding and have not had a menstrual period within 12 weeks after you stopped breastfeeding. °· You are not breastfeeding and have not had a menstrual period by the 12th week after delivery. °· You have a fever. °SEEK IMMEDIATE MEDICAL CARE IF:  °· You have persistent pain. °· You have chest pain. °· You have shortness of breath. °· You faint. °· You have leg pain. °· You have stomach pain. °· Your vaginal bleeding saturates two or more sanitary pads   in 1 hour. °MAKE SURE YOU:  °· Understand these instructions. °· Will watch your condition. °· Will get help right away if you are not doing well or get worse. ° ° °Document Released: 07/17/2000 Document Revised: 04/13/2012 Document Reviewed: 03/16/2012 °ExitCare® Patient Information ©2015 [employer]. This information is not intended to  replace advice given to you by your health care provider. Make sure you discuss any questions you have with your health care provider. ° °

## 2014-01-19 NOTE — Discharge Summary (Signed)
Obstetric Discharge Summary Reason for Admission: onset of labor Prenatal Procedures: none Intrapartum Procedures: spontaneous vaginal delivery Postpartum Procedures: none Complications-Operative and Postpartum: none Hemoglobin  Date Value Ref Range Status  01/17/2014 12.8  12.0 - 15.0 g/dL Final     HCT  Date Value Ref Range Status  01/17/2014 38.0  36.0 - 46.0 % Final   Hospital Course: Obstetric Attending MAU Note  Chief Complaint: Labor Eval  HPI: Gabrielle Baker is a 28 y.o. [id number] at 3335w2d who presents to maternity admissions reporting possible ROM and small amount of bleeding. Reports painful contractions every 3 minutes. Good fetal movement. Of note, patient was discharged earlier today after being observed for NRNST; she had a BPP of 10/10. Cervical exam at discharge was 3-4/70/-2/cephalic, there were no signs of labor at discharge.  Pregnancy Course: [facility] Had four visits.  Patient Active Problem List    Diagnosis  Date Noted   .  Non-stress test nonreactive  01/15/2014   .  Major depressive disorder, single episode, severe, specified as with psychotic behavior  02/13/2013   .  Cocaine abuse  02/13/2013   .  Opioid abuse with opioid-induced mood disorder  02/13/2013    LABOR ADMISSION HISTORY AND PHYSICAL  Gabrielle Baker is a 28 y.o. female [phone] [id number] with IUP at 6867w3d presenting for active labor.  Felt having contractions since last night.  Reports to have LOF and some bleeding. Evaluated last night in the MAU and found to have bloody show. Upon cervical exam upon admission was found to be intact.  History of bipolar, depression and anxiety. Not currently on any medications.  UDS during pregnancy postive for cocaine and smoking during pregnancy. [facility].   Review of Systems  All systems reviewed and negative except as stated in HPI  Last menstrual period 04/21/2013, unknown if currently  breastfeeding.  General appearance: alert, cooperative and moderate distress  Abdomen: soft, non-tender; bowel sounds normal  Extremities: Homans sign is negative, no sign of DVT  Presentation: cephalic  Fetal monitoringBaseline: 145 bpm, Variability: Fair (1-6 bpm), Accelerations: Reactive and Decelerations: Absent  Uterine activity: Every 5 minutes.   Prenatal labs:  ABO, Rh:  Antibody:  Rubella:  RPR:  HBsAg:  HIV: NONREACTIVE (06/15 1842)  GBS:  1 hr Glucola 115  Genetic screening Too late  Anatomy US Normal   #Labor: progressing spontaneously  #Pain: Epidural  #FWB: Cat II  #ID: GBS +, Amp  #MOF: Breast  #MOC:nexplanon  #Circ: Female  Myra RudeSchmitz, Jeremy E  01/17/2014, 7:25 PM  Delivery Note  At 10:50 PM a viable female was delivered via Vaginal, Spontaneous Delivery (Presentation: Right Occiput Anterior). APGAR: 8, 9; weight 7 lb 7.9 oz (3400 g). Nuchal cord x 1 easily reduced prior to delivery.  Placenta status: Intact, Spontaneous. Cord: 3 vessels.  Anesthesia: Epidural  Episiotomy: None  Lacerations: None  Est. Blood Loss (mL): 300  Mom to postpartum. Baby to Couplet care / Skin to Skin.  Bing PlumeGervasi, Kristin E  01/17/2014, 11:18 PM   Physical Exam:  General: alert, cooperative and no distress Lochia: appropriate Uterine Fundus: firm Incision: N/A DVT Evaluation: No evidence of DVT seen on physical exam. No cords or calf tenderness. No significant calf/ankle edema.  Discharge Diagnoses: Term Pregnancy-delivered  Discharge Information: Date: 01/19/2014 Activity: unrestricted Diet: routine Medications: None Condition: stable Instructions: refer to practice specific booklet Discharge to: home Follow-up Information   [facility] [place]. Schedule an appointment as soon as possible for a visit in 4 weeks. (For Postpartum Visit)    Contact information:   [address] [place] [address] [phone]      Newborn Data: Live born  female  Birth Weight: 7 lb 7.9 oz (3399 g) APGAR: 8, 9  Home with mother.  Tawana ScaleODOM, MICHAEL RYAN 01/19/2014, 8:46 AM

## 2014-01-21 ENCOUNTER — Inpatient Hospital Stay (HOSPITAL_COMMUNITY): Admit: 2014-01-21 | Payer: Medicaid Other

## 2014-01-24 ENCOUNTER — Inpatient Hospital Stay (HOSPITAL_COMMUNITY): Admission: RE | Admit: 2014-01-24 | Payer: Self-pay | Source: Ambulatory Visit

## 2014-03-10 ENCOUNTER — Emergency Department (HOSPITAL_COMMUNITY)
Admission: EM | Admit: 2014-03-10 | Discharge: 2014-03-11 | Disposition: A | Discharge status: Home / Self Care | Payer: Medicaid Other | Attending: Emergency Medicine | Admitting: Emergency Medicine

## 2014-03-10 ENCOUNTER — Encounter (HOSPITAL_COMMUNITY): Payer: Self-pay | Admitting: Emergency Medicine

## 2014-03-10 DIAGNOSIS — F17200 Nicotine dependence, unspecified, uncomplicated: Secondary | ICD-10-CM | POA: Insufficient documentation

## 2014-03-10 DIAGNOSIS — S46909A Unspecified injury of unspecified muscle, fascia and tendon at shoulder and upper arm level, unspecified arm, initial encounter: Secondary | ICD-10-CM | POA: Insufficient documentation

## 2014-03-10 DIAGNOSIS — R197 Diarrhea, unspecified: Secondary | ICD-10-CM | POA: Insufficient documentation

## 2014-03-10 DIAGNOSIS — S20211A Contusion of right front wall of thorax, initial encounter: Secondary | ICD-10-CM

## 2014-03-10 DIAGNOSIS — Y9389 Activity, other specified: Secondary | ICD-10-CM | POA: Insufficient documentation

## 2014-03-10 DIAGNOSIS — S20219A Contusion of unspecified front wall of thorax, initial encounter: Secondary | ICD-10-CM | POA: Insufficient documentation

## 2014-03-10 DIAGNOSIS — Z8659 Personal history of other mental and behavioral disorders: Secondary | ICD-10-CM | POA: Insufficient documentation

## 2014-03-10 DIAGNOSIS — Y92410 Unspecified street and highway as the place of occurrence of the external cause: Secondary | ICD-10-CM | POA: Insufficient documentation

## 2014-03-10 DIAGNOSIS — S4980XA Other specified injuries of shoulder and upper arm, unspecified arm, initial encounter: Secondary | ICD-10-CM | POA: Insufficient documentation

## 2014-03-10 NOTE — ED Notes (Signed)
Pt. reports diarrhea x3 , headache and joint pains onset 3 days ago , pt. also concerned about contracting HIV - she noticed 2 puncture marks at her left arm . Denies fever or chills.

## 2014-03-11 ENCOUNTER — Emergency Department (HOSPITAL_COMMUNITY): Payer: Medicaid Other

## 2014-03-11 LAB — HIV ANTIBODY (ROUTINE TESTING W REFLEX): HIV 1&2 Ab, 4th Generation: NONREACTIVE

## 2014-03-11 MED ORDER — DIPHENOXYLATE-ATROPINE 2.5-0.025 MG PO TABS: 1.0000 | ORAL_TABLET | Freq: Four times a day (QID) | ORAL | Status: DC | PRN

## 2014-03-11 MED ORDER — NAPROXEN 500 MG PO TABS: 500.0000 mg | ORAL_TABLET | Freq: Two times a day (BID) | ORAL | Status: DC

## 2014-03-11 MED ORDER — NAPROXEN 250 MG PO TABS
500.0000 mg | ORAL_TABLET | Freq: Once | ORAL | Status: AC
  Administered 2014-03-11: 500 mg via ORAL
  Filled 2014-03-11: qty 2
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
## 2014-03-11 NOTE — Discharge Instructions (Signed)
Your xray was normal - no signs of fracture of the ribs - you may have a small fracture that is just not being seen on the xray - take the naprosyn twice daily for this.  Lomotil for diarrhea.   Emergency Department Resource Guide 1) Find a Doctor and Pay Out of Pocket Although you won't have to find out who is covered by your insurance plan, it is a good idea to ask around and get recommendations. You will then need to call the office and see if the doctor you have chosen will accept you as a new patient and what types of options they offer for patients who are self-pay. Some doctors offer discounts or will set up payment plans for their patients who do not have insurance, but you will need to ask so you aren't surprised when you get to your appointment.  2) Contact Your Local Health Department Not all health departments have doctors that can see patients for sick visits, but many do, so it is worth a call to see if yours does. If you don't know where your local health department is, you can check in your phone book. The CDC also has a tool to help you locate your state's health department, and many state websites also have listings of all of their local health departments.  3) Find a Walk-in Clinic If your illness is not likely to be very severe or complicated, you may want to try a walk in clinic. These are popping up all over the country in pharmacies, drugstores, and shopping centers. They're usually staffed by nurse practitioners or physician assistants that have been trained to treat common illnesses and complaints. They're usually fairly quick and inexpensive. However, if you have serious medical issues or chronic medical problems, these are probably not your best option.  No Primary Care Doctor: - Call Health Connect at  [phone] - they can help you locate a primary care doctor that  accepts your insurance, provides certain services, etc. - Physician Referral Service-  [phone]  Chronic Pain Problems: Organization         Address  Phone   Notes  [facility]  [phone] Patients need to be referred by their primary care doctor.   Medication Assistance: Organization         Address  Phone   Notes  [facility] [address] [place], [place] [address] [phone] --Must be a resident of [place] [facility] [place] -- Must have NO insurance coverage whatsoever (no Medicaid/ Medicare, etc.) -- The pt. MUST have a primary care doctor that directs their care regularly and follows them in the community   [facility]  [phone]   [employer]  [phone]    Agencies that provide inexpensive medical care: Organization         Address  Phone   Notes  [facility]  [phone]   [facility]    [phone]   [facility] [address] [place], [place] [address] [phone]   [facility] [address], [place] [phone]   [facility]    [phone]   [facility]    [phone]   [facility]  [address], [place] Phone:  [phone], Fax:  [phone] Hours of Operation:  9 am - 6 pm, M-F.  Also accepts Medicaid/Medicare and  self-pay.  [facility] for [place], [address], [place] Phone: [phone], Fax: [phone]. Hours of Operation:  8:30 am - 5:30 pm, M-F.  Also accepts Medicaid and self-pay.  [facility] [address] [place] Phone: [phone]   [place], [place], [place] [phone], Ext. [phone] Mondays & Thursdays: 7-9 AM.  First 15 patients are seen on a first come, first serve basis.    [facility] Providers:  Organization         Address  Phone   Notes  [facility] [address],  [place] [phone] Also accepts self-pay patients.  [facility] [id number] [place], [place]  [phone]   [place], [address], [place] [phone]   [facility] [address], [place] [phone]   Lucianne Lei [address], [place]   [phone] Only accepts [place] Access [place] patients after they have their name applied to their card.   Self-Pay (no insurance) in [facility]:  Organization         Address  Phone   Notes  Sickle Cell Patients, [facility] [place] [phone]   [facility] [place] [phone]   [facility] [place]  [place], [place], [place] [phone]   [facility]/Dr. Osei-Bonsu  [address], [place] or [address], [place] [phone] Phone number for both [place] and [place] locations is the same.  [facility] [address] [place] [phone]   [facility] [address], [place] or [address] [phone]   [facility] [address] [place] [phone], phone; [phone], fax Sees patients 1st and 3rd Saturday of every month.  Must not qualify for public or private insurance (i.e. Medicaid, Medicare, [place] Health Choice, Veterans' Benefits)  Household income should be no more than 200% of the poverty level The clinic cannot treat you if you are pregnant or think you are pregnant  Sexually transmitted diseases are not treated at the clinic.    Dental Care: Organization         Address  Phone  Notes  [facility] Department of [facility] [place] [phone] Accepts children up to age 83 who are enrolled in  [place] or [place]; pregnant women with a Medicaid card; and children who have applied for Medicaid or [place] Health Choice, but were declined, whose parents can pay a reduced fee at time of service.  [facility] Department of [facility]  [address], [place] [phone] Accepts children up to age 32 who are enrolled in [place] or [place]; pregnant women with a Medicaid card; and children who have applied for Medicaid or [place] Health Choice, but were declined, whose parents can pay a reduced fee at time of service.  [facility]  [place] [phone] Patients are seen by appointment only. Walk-ins are not accepted. [place] will see patients 28 years of age and older. Monday - Tuesday (8am-5pm) Most Wednesdays (8:30-5pm) $  30 per visit, cash only  [facility] Adult [employer] PROGRAM  [address], [facility] [phone] Patients are seen by appointment only. Walk-ins are not accepted. [facility] will see patients 34 years of age and older. One Wednesday Evening (Monthly: Volunteer Based).  $30 per visit, cash only  [employer]  [phone] for adults; Children under age 82, call Graduate Pediatric Dentistry at [phone]. Children aged 73-14, please call [phone] to request a pediatric application.  Dental services are provided in all areas of dental care including fillings, crowns and bridges, complete and partial dentures, implants, gum treatment, root canals, and extractions. Preventive care is also provided. Treatment is provided to both adults and children. Patients are selected via a lottery and there is often a waiting list.   [facility] [address] [place]  [phone] [email or web address]   [facility] [address] [place], [place] [phone], Ext. [phone] Second and Fourth Thursday of each month, opens at 6:30  AM; Clinic ends at 9 AM.  Patients are seen on a first-come first-served basis, and a limited number are seen during each clinic.   [facility]  [address] [place], [place] [phone]   Eligibility Requirements You must have lived in [place], [place], or [place] counties for at least the last three months.   You cannot be eligible for state or federal sponsored [place], including [employer], [place], or [employer].   You generally cannot be eligible for healthcare insurance through your employer.    How to apply: Eligibility screenings are held every Tuesday and Wednesday afternoon from 1:00 pm until 4:00 pm. You do not need an appointment for the interview!  [facility] [address] [place], [place] [phone]   [facility] Health Department  [phone]   [facility] Health Department  [phone]   [facility] Health Department  [phone]

## 2014-04-10 ENCOUNTER — Encounter (HOSPITAL_COMMUNITY): Payer: Self-pay | Admitting: Emergency Medicine

## 2014-04-10 ENCOUNTER — Emergency Department (HOSPITAL_COMMUNITY)
Admission: EM | Admit: 2014-04-10 | Discharge: 2014-04-11 | Disposition: A | Discharge status: Other Acute Inpatient Hospital | Payer: Medicaid Other | Attending: Emergency Medicine | Admitting: Emergency Medicine

## 2014-04-10 DIAGNOSIS — F1210 Cannabis abuse, uncomplicated: Secondary | ICD-10-CM | POA: Insufficient documentation

## 2014-04-10 DIAGNOSIS — B86 Scabies: Secondary | ICD-10-CM | POA: Insufficient documentation

## 2014-04-10 DIAGNOSIS — O99340 Other mental disorders complicating pregnancy, unspecified trimester: Secondary | ICD-10-CM | POA: Insufficient documentation

## 2014-04-10 DIAGNOSIS — IMO0001 Reserved for inherently not codable concepts without codable children: Secondary | ICD-10-CM

## 2014-04-10 DIAGNOSIS — F1110 Opioid abuse, uncomplicated: Secondary | ICD-10-CM | POA: Insufficient documentation

## 2014-04-10 DIAGNOSIS — F1114 Opioid abuse with opioid-induced mood disorder: Secondary | ICD-10-CM

## 2014-04-10 DIAGNOSIS — F323 Major depressive disorder, single episode, severe with psychotic features: Secondary | ICD-10-CM

## 2014-04-10 DIAGNOSIS — F22 Delusional disorders: Secondary | ICD-10-CM | POA: Insufficient documentation

## 2014-04-10 DIAGNOSIS — O288 Other abnormal findings on antenatal screening of mother: Secondary | ICD-10-CM

## 2014-04-10 DIAGNOSIS — Z3490 Encounter for supervision of normal pregnancy, unspecified, unspecified trimester: Secondary | ICD-10-CM

## 2014-04-10 DIAGNOSIS — O98819 Other maternal infectious and parasitic diseases complicating pregnancy, unspecified trimester: Secondary | ICD-10-CM | POA: Insufficient documentation

## 2014-04-10 DIAGNOSIS — O99330 Smoking (tobacco) complicating pregnancy, unspecified trimester: Secondary | ICD-10-CM | POA: Insufficient documentation

## 2014-04-10 DIAGNOSIS — F29 Unspecified psychosis not due to a substance or known physiological condition: Secondary | ICD-10-CM

## 2014-04-10 DIAGNOSIS — F1410 Cocaine abuse, uncomplicated: Secondary | ICD-10-CM | POA: Insufficient documentation

## 2014-04-10 DIAGNOSIS — F411 Generalized anxiety disorder: Secondary | ICD-10-CM | POA: Insufficient documentation

## 2014-04-10 LAB — COMPREHENSIVE METABOLIC PANEL
ALK PHOS: 66 U/L (ref 39–117)
ALT: 14 U/L (ref 0–35)
AST: 16 U/L (ref 0–37)
Albumin: 4.5 g/dL (ref 3.5–5.2)
Anion gap: 14 (ref 5–15)
BUN: 14 mg/dL (ref 6–23)
CO2: 24 mEq/L (ref 19–32)
Calcium: 9.6 mg/dL (ref 8.4–10.5)
Chloride: 99 mEq/L (ref 96–112)
Creatinine, Ser: 0.85 mg/dL (ref 0.50–1.10)
GFR calc Af Amer: >90 mL/min (ref >90)
GFR calc non Af Amer: >90 mL/min (ref >90)
GLUCOSE: 125 mg/dL — AB (ref 70–99)
POTASSIUM: 3.5 meq/L — AB (ref 3.7–5.3)
SODIUM: 137 meq/L (ref 137–147)
TOTAL PROTEIN: 8.4 g/dL — AB (ref 6.0–8.3)
Total Bilirubin: 0.6 mg/dL (ref 0.3–1.2)

## 2014-04-10 LAB — RAPID URINE DRUG SCREEN, HOSP PERFORMED
AMPHETAMINES: NOT DETECTED
Barbiturates: NOT DETECTED
Benzodiazepines: NOT DETECTED
COCAINE: POSITIVE — AB
OPIATES: POSITIVE — AB
Tetrahydrocannabinol: POSITIVE — AB

## 2014-04-10 LAB — URINALYSIS, ROUTINE W REFLEX MICROSCOPIC
GLUCOSE, UA: NEGATIVE mg/dL
Hgb urine dipstick: NEGATIVE
Ketones, ur: 15 mg/dL — AB
NITRITE: NEGATIVE
PH: 6 (ref 5.0–8.0)
Protein, ur: 30 mg/dL — AB
SPECIFIC GRAVITY, URINE: 1.033 — AB (ref 1.005–1.030)
Urobilinogen, UA: 1 mg/dL (ref 0.0–1.0)

## 2014-04-10 LAB — CBC
HCT: 40.3 % (ref 36.0–46.0)
HEMOGLOBIN: 13.5 g/dL (ref 12.0–15.0)
MCH: 29.4 pg (ref 26.0–34.0)
MCHC: 33.5 g/dL (ref 30.0–36.0)
MCV: 87.8 fL (ref 78.0–100.0)
Platelets: 269 10*3/uL (ref 150–400)
RBC: 4.59 MIL/uL (ref 3.87–5.11)
RDW: 14.8 % (ref 11.5–15.5)
WBC: 9.2 10*3/uL (ref 4.0–10.5)

## 2014-04-10 LAB — ACETAMINOPHEN LEVEL

## 2014-04-10 LAB — URINE MICROSCOPIC-ADD ON

## 2014-04-10 LAB — HCG, QUANTITATIVE, PREGNANCY: hCG, Beta Chain, Quant, S: 123 m[IU]/mL — ABNORMAL HIGH (ref <5)

## 2014-04-10 LAB — ETHANOL: Alcohol, Ethyl (B): <11 mg/dL (ref 0–11)

## 2014-04-10 LAB — PREGNANCY, URINE: PREG TEST UR: POSITIVE — AB

## 2014-04-10 LAB — TSH: TSH: 0.718 u[IU]/mL (ref 0.350–4.500)

## 2014-04-10 LAB — SALICYLATE LEVEL: Salicylate Lvl: <2 mg/dL — ABNORMAL LOW (ref 2.8–20.0)

## 2014-04-10 MED ORDER — PERMETHRIN 1 % EX LOTN
TOPICAL_LOTION | Freq: Once | CUTANEOUS | Status: AC
  Administered 2014-04-10: 14:00:00 via TOPICAL
  Filled 2014-04-10: qty 59

## 2014-04-10 MED ORDER — HYDROXYZINE HCL 25 MG PO TABS
25.0000 mg | ORAL_TABLET | Freq: Three times a day (TID) | ORAL | Status: DC | PRN
  Administered 2014-04-11: 25 mg via ORAL
  Filled 2014-04-10: qty 1

## 2014-04-10 MED ORDER — STERILE WATER FOR INJECTION IJ SOLN
INTRAMUSCULAR | Status: AC
  Administered 2014-04-10: 10 mL
  Filled 2014-04-10: qty 10

## 2014-04-10 MED ORDER — ZIPRASIDONE MESYLATE 20 MG IM SOLR
20.0000 mg | Freq: Four times a day (QID) | INTRAMUSCULAR | Status: DC | PRN
  Administered 2014-04-10: 20 mg via INTRAMUSCULAR
  Filled 2014-04-10: qty 20

## 2014-04-10 MED ORDER — PREDNISONE 20 MG PO TABS
20.0000 mg | ORAL_TABLET | Freq: Once | ORAL | Status: AC
Start: 2014-04-10 — End: 2014-04-10
  Administered 2014-04-10: 20 mg via ORAL
  Filled 2014-04-10: qty 1

## 2014-04-10 MED ORDER — LORAZEPAM 1 MG PO TABS
1.0000 mg | ORAL_TABLET | ORAL | Status: DC | PRN
  Administered 2014-04-10 – 2014-04-11 (×2): 1 mg via ORAL
  Filled 2014-04-10 (×2): qty 1

## 2014-04-10 MED ORDER — ONDANSETRON HCL 4 MG PO TABS: 4.0000 mg | ORAL_TABLET | Freq: Three times a day (TID) | ORAL | Status: DC | PRN

## 2014-04-10 MED ORDER — PERMETHRIN 1 % EX LOTN
TOPICAL_LOTION | Freq: Once | CUTANEOUS | Status: AC
  Administered 2014-04-10: 16:00:00 via TOPICAL
  Filled 2014-04-10: qty 59

## 2014-04-10 MED ORDER — HYDROXYZINE HCL 25 MG PO TABS
25.0000 mg | ORAL_TABLET | Freq: Three times a day (TID) | ORAL | Status: DC | PRN
  Administered 2014-04-10: 25 mg via ORAL
  Filled 2014-04-10: qty 1

## 2014-04-10 NOTE — Consult Note (Signed)
[facility] Consult   Reason for Consult:  psychosis Referring Physician:  ER MD  Gabrielle Baker is an 28 y.o. female. Total Time spent with patient: 45 minutes  Assessment: AXIS I:  Psychotic Disorder NOS AXIS II:  Deferred AXIS III:   Past Medical History  Diagnosis Date  . No pertinent past medical history   . Polysubstance abuse   . Mental disorder   . Depression   . Anxiety    AXIS IV:  chronic mental illness AXIS V:  41-50 serious symptoms  Plan:  Recommend psychiatric Inpatient admission when medically cleared.  Subjective:   Gabrielle Baker is a 28 y.o. female patient admitted with psychotic thinking.  HPI:  Gabrielle Baker is irritated and a a bit hostile. She is scratching furiously all over her body.  Reportedly she has scabies.  She says she hears voices and that" the devil is trying to get me".  She says she has heard voices all her life.  She says she has never taken any meds for the voices and has not gotten treatment for them.  Her records indicate otherwise and the accompanying notes say she has been off her medication for one week.  Consequently, she cannot be considered a reliable historian.She said " I feel like a whole bunch of people are trying to hurt me".  She thought we were making jokes and laughing at her.  When asked if she lived alone or with somebody else, she said"with God".  She was insistent she be given something now to make her better but it was unclear as to whether she was talking about the itching or the psychosis. HPI Elements:   Location:  psychotic thinking. Quality:  hearing voices and paranoia is present. Severity:  afraid to go home. Timing:  no noted precipitants. Duration:  sililar episode in 2013 when admitted to [facility]. Context:  as above.  Past Psychiatric History: Past Medical History  Diagnosis Date  . No pertinent past medical history   . Polysubstance abuse   . Mental disorder   . Depression   . Anxiety     reports  that she has been smoking Cigarettes.  She has a 2.5 pack-year smoking history. She does not have any smokeless tobacco history on file. She reports that she uses illicit drugs (Marijuana, "Crack" cocaine, and Cocaine). She reports that she does not drink alcohol. Family History  Problem Relation Age of Onset  . Diabetes Maternal Grandmother   . Heart disease Neg Hx   . Hypertension Neg Hx   . Stroke Neg Hx            Allergies:   Allergies  Allergen Reactions  . Acetaminophen Diarrhea    Patient states she tolerates percocet    ACT Assessment Complete:  Yes:    Educational Status    Risk to Self: Risk to self with the past 6 months Is patient at risk for suicide?: Yes Substance abuse history and/or treatment for substance abuse?: No  Risk to Others:    Abuse:    Prior Inpatient Therapy:    Prior Outpatient Therapy:    Additional Information:                    Objective: Blood pressure 138/68, pulse 81, temperature 98.2 F (36.8 C), temperature source Oral, resp. rate 16, SpO2 98.00%, unknown if currently breastfeeding.There is no weight on file to calculate BMI. Results for orders placed during the hospital encounter of  04/10/14 (from the past 72 hour(s))  ACETAMINOPHEN LEVEL     Status: None   Collection Time    04/10/14 11:05 AM      Result Value Ref Range   Acetaminophen (Tylenol), Serum <15.0  10 - 30 ug/mL   Comment:            THERAPEUTIC CONCENTRATIONS VARY     SIGNIFICANTLY. A RANGE OF 10-30     ug/mL MAY BE AN EFFECTIVE     CONCENTRATION FOR MANY PATIENTS.     HOWEVER, SOME ARE BEST TREATED     AT CONCENTRATIONS OUTSIDE THIS     RANGE.     ACETAMINOPHEN CONCENTRATIONS     >150 ug/mL AT 4 HOURS AFTER     INGESTION AND >50 ug/mL AT 12     HOURS AFTER INGESTION ARE     OFTEN ASSOCIATED WITH TOXIC     REACTIONS.  CBC     Status: None   Collection Time    04/10/14 11:05 AM      Result Value Ref Range   WBC 9.2  4.0 - 10.5 K/uL   RBC 4.59   3.87 - 5.11 MIL/uL   Hemoglobin 13.5  12.0 - 15.0 g/dL   HCT 40.3  36.0 - 46.0 %   MCV 87.8  78.0 - 100.0 fL   MCH 29.4  26.0 - 34.0 pg   MCHC 33.5  30.0 - 36.0 g/dL   RDW 14.8  11.5 - 15.5 %   Platelets 269  150 - 400 K/uL  COMPREHENSIVE METABOLIC PANEL     Status: Abnormal   Collection Time    04/10/14 11:05 AM      Result Value Ref Range   Sodium 137  137 - 147 mEq/L   Potassium 3.5 (*) 3.7 - 5.3 mEq/L   Chloride 99  96 - 112 mEq/L   CO2 24  19 - 32 mEq/L   Glucose, Bld 125 (*) 70 - 99 mg/dL   BUN 14  6 - 23 mg/dL   Creatinine, Ser 0.85  0.50 - 1.10 mg/dL   Calcium 9.6  8.4 - 10.5 mg/dL   Total Protein 8.4 (*) 6.0 - 8.3 g/dL   Albumin 4.5  3.5 - 5.2 g/dL   AST 16  0 - 37 U/L   ALT 14  0 - 35 U/L   Alkaline Phosphatase 66  39 - 117 U/L   Total Bilirubin 0.6  0.3 - 1.2 mg/dL   GFR calc non Af Amer >90  >90 mL/min   GFR calc Af Amer >90  >90 mL/min   Comment: (NOTE)     The eGFR has been calculated using the CKD EPI equation.     This calculation has not been validated in all clinical situations.     eGFR's persistently <90 mL/min signify possible Chronic Kidney     Disease.   Anion gap 14  5 - 15  ETHANOL     Status: None   Collection Time    04/10/14 11:05 AM      Result Value Ref Range   Alcohol, Ethyl (B) <11  0 - 11 mg/dL   Comment:            LOWEST DETECTABLE LIMIT FOR     SERUM ALCOHOL IS 11 mg/dL     FOR MEDICAL PURPOSES ONLY  SALICYLATE LEVEL     Status: Abnormal   Collection Time    04/10/14 11:05 AM  Result Value Ref Range   Salicylate Lvl <8.5 (*) 2.8 - 20.0 mg/dL   Labs are reviewed and are pertinent for no drugs.  Current Facility-Administered Medications  Medication Dose Route Frequency Provider Last Rate Last Dose  . hydrOXYzine (ATARAX/VISTARIL) tablet 25 mg  25 mg Oral TID PRN Tanna Furry, MD      . LORazepam (ATIVAN) tablet 1 mg  1 mg Oral Q30 min PRN Tanna Furry, MD   1 mg at 04/10/14 1234  . permethrin (ELIMITE) 1 % lotion   Topical Once  Tanna Furry, MD      . ziprasidone (GEODON) injection 20 mg  20 mg Intramuscular Q6H PRN Tanna Furry, MD   20 mg at 04/10/14 1300   Current Outpatient Prescriptions  Medication Sig Dispense Refill  . albuterol (PROVENTIL HFA;VENTOLIN HFA) 108 (90 BASE) MCG/ACT inhaler Inhale 1-2 puffs into the lungs every 6 (six) hours as needed for wheezing.  1 Inhaler  0  . lamoTRIgine (LAMICTAL) 150 MG tablet Take 150 mg by mouth 3 (three) times daily.      Marland Kitchen OLANZapine (ZYPREXA) 20 MG tablet Take 20 mg by mouth at bedtime.      . sertraline (ZOLOFT) 50 MG tablet Take 50 mg by mouth daily.        Psychiatric Specialty Exam:     Blood pressure 138/68, pulse 81, temperature 98.2 F (36.8 C), temperature source Oral, resp. rate 16, SpO2 98.00%, unknown if currently breastfeeding.There is no weight on file to calculate BMI.  General Appearance: Disheveled  Eye Sport and exercise psychologist::  Fair  Speech:  Clear and Coherent  Volume:  Normal  Mood:  Irritable  Affect:  Labile  Thought Process:  Coherent  Orientation:  Full (Time, Place, and Person)  Thought Content:  Hallucinations: Auditory and Paranoid Ideation  Suicidal Thoughts:  Yes.  without intent/plan  Homicidal Thoughts:  No  Memory:  Immediate;   Fair Recent;   Fair Remote;   Fair  Judgement:  Impaired  Insight:  Lacking  Psychomotor Activity:  Normal  Concentration:  Fair  Recall:  [employer] of Knowledge:Good  Language: Good  Akathisia:  Negative  Handed:  Right  AIMS (if indicated):     Assets:  Communication Skills  Sleep:      Musculoskeletal: Strength & Muscle Tone: within normal limits Gait & Station: normal Patient leans: N/A  Treatment Plan Summary: Daily contact with patient to assess and evaluate symptoms and progress in treatment Medication management seek inpatient bed for treatment of psychosis  TAYLOR,GERALD D 04/10/2014 1:27 PM

## 2014-04-10 NOTE — ED Notes (Signed)
Pt resting with eyes closed in bed. Pt urinated in bed pan.

## 2014-04-10 NOTE — ED Notes (Addendum)
Patient endorses SI. States that she hears voices telling her to pray. Patient tearful, anxious. Continues to scratch at multiple sites on skin. Denies HI.   Encouragement offered. Given Vistaril.  Q 15 safety checks continue.

## 2014-04-10 NOTE — ED Notes (Signed)
Bed: ZO10 Expected date:  Expected time:  Means of arrival:  Comments: Scabies treatment

## 2014-04-10 NOTE — ED Notes (Signed)
Bed: WA25 Expected date:  Expected time:  Means of arrival:  Comments: ems  

## 2014-04-10 NOTE — ED Provider Notes (Signed)
CSN: [id number]     Arrival date & time 04/10/14  1014 History   First MD Initiated Contact with Patient 04/10/14 1021     Chief Complaint  Patient presents with  . Suicidal  . Homicidal  . Anxiety      HPI  Patient presents for evaluation of anxiety and some paranoia. She states she has a history of anxiety and depression. Said she was seen and evaluated for many years ago, in [place]. No psychiatric evaluation or treatment recently.   She states that she feels like "I was born with the wrong blood and maybe the world was against me". States its days.became more pronounced after speaking with her mother on Sunday night. She thought her mother was trying to hurt her. She states that she feels like the devil is "trying to capture my soul" and that he is using the television to do this. She cannot give me any specific instances that she may have heard. She then states as we are talking that she feels that the sign on the wall of her emergency department room is directed towards her.  Is been staying at her friend's house. Has had an itching rash on her body since being there. She cannot tell if the house is dirty or if there is a possibility of scabies in other occupants. She denies drug and alcohol use. She does smoke. She denies any current over-the-counter or prescription medications and street drugs.  Past Medical History  Diagnosis Date  . No pertinent past medical history   . Polysubstance abuse   . Mental disorder   . Depression   . Anxiety    Past Surgical History  Procedure Laterality Date  . No past surgeries     Family History  Problem Relation Age of Onset  . Diabetes Maternal Grandmother   . Heart disease Neg Hx   . Hypertension Neg Hx   . Stroke Neg Hx    History  Substance Use Topics  . Smoking status: Current Every Day Smoker -- 0.25 packs/day for 10 years    Types: Cigarettes  . Smokeless tobacco: Not on file  . Alcohol Use: No   OB History   Grav Para  Term Preterm Abortions TAB SAB Ect Mult Living   0 1 0 1 0 0 4     Review of Systems  Constitutional: Negative for fever, chills, diaphoresis, appetite change and fatigue.  HENT: Negative for mouth sores, sore throat and trouble swallowing.   Eyes: Negative for visual disturbance.  Respiratory: Negative for cough, chest tightness, shortness of breath and wheezing.   Cardiovascular: Negative for chest pain.  Gastrointestinal: Negative for nausea, vomiting, abdominal pain, diarrhea and abdominal distention.  Endocrine: Negative for polydipsia, polyphagia and polyuria.  Genitourinary: Negative for dysuria, frequency and hematuria.  Musculoskeletal: Negative for gait problem.  Skin: Positive for rash. Negative for color change and pallor.  Neurological: Negative for dizziness, syncope, light-headedness and headaches.  Hematological: Does not bruise/bleed easily.  Psychiatric/Behavioral: Positive for hallucinations, sleep disturbance, dysphoric mood and decreased concentration. Negative for suicidal ideas, behavioral problems, confusion and self-injury. The patient is nervous/anxious.       Allergies  Acetaminophen  Home Medications   Prior to Admission medications   Medication Sig Start Date End Date Taking? Authorizing Provider  albuterol (PROVENTIL HFA;VENTOLIN HFA) 108 (90 BASE) MCG/ACT inhaler Inhale 1-2 puffs into the lungs every 6 (six) hours as needed for wheezing. 03/25/13  Yes April K Palumbo-Rasch,  MD  lamoTRIgine (LAMICTAL) 150 MG tablet Take 150 mg by mouth 3 (three) times daily.   Yes Historical Provider, MD  OLANZapine (ZYPREXA) 20 MG tablet Take 20 mg by mouth at bedtime.   Yes Historical Provider, MD  sertraline (ZOLOFT) 50 MG tablet Take 50 mg by mouth daily.   Yes Historical Provider, MD   BP 114/76  Pulse 86  Temp(Src) 98.6 F (37 C) (Oral)  Resp 22  SpO2 97% Physical Exam  Constitutional: She is oriented to person, place, and time. She appears  well-developed and well-nourished. No distress.  HENT:  Head: Normocephalic.  Eyes: Conjunctivae are normal. Pupils are equal, round, and reactive to light. No scleral icterus.  Neck: Normal range of motion. Neck supple. No thyromegaly present.  Cardiovascular: Normal rate and regular rhythm.  Exam reveals no gallop and no friction rub.   No murmur heard. Pulmonary/Chest: Effort normal and breath sounds normal. No respiratory distress. She has no wheezes. She has no rales.  Abdominal: Soft. Bowel sounds are normal. She exhibits no distension. There is no tenderness. There is no rebound.  Musculoskeletal: Normal range of motion.  Neurological: She is alert and oriented to person, place, and time.  Skin: Skin is warm and dry. Rash noted.  Multiple diffuse small papules. They're pruritic. Not vesicular. DG O. Not frankly urticarial. Consistent with scabies. Present on the arms legs trunk front and back.  Psychiatric: Her mood appears anxious. Her speech is rapid and/or pressured. Her speech is not delayed and not tangential. She is hyperactive. Thought content is paranoid and delusional. Cognition and memory are not impaired. She does not express impulsivity or inappropriate judgment. She expresses no homicidal and no suicidal ideation. She expresses no suicidal plans and no homicidal plans.  Increased psychomotor activity. I am putting her self her crossing her arms on crossing her arms. Scratching at her rash.    ED Course  Procedures (including critical care time) Labs Review Labs Reviewed  COMPREHENSIVE METABOLIC PANEL - Abnormal; Notable for the following:    Potassium 3.5 (*)    Glucose, Bld 125 (*)    Total Protein 8.4 (*)    All other components within normal limits  SALICYLATE LEVEL - Abnormal; Notable for the following:    Salicylate Lvl <2.0 (*)    All other components within normal limits  URINE RAPID DRUG SCREEN (HOSP PERFORMED) - Abnormal; Notable for the following:     Opiates POSITIVE (*)    Cocaine POSITIVE (*)    Tetrahydrocannabinol POSITIVE (*)    All other components within normal limits  URINALYSIS, ROUTINE W REFLEX MICROSCOPIC - Abnormal; Notable for the following:    Color, Urine AMBER (*)    APPearance TURBID (*)    Specific Gravity, Urine 1.033 (*)    Bilirubin Urine SMALL (*)    Ketones, ur 15 (*)    Protein, ur 30 (*)    Leukocytes, UA MODERATE (*)    All other components within normal limits  PREGNANCY, URINE - Abnormal; Notable for the following:    Preg Test, Ur POSITIVE (*)    All other components within normal limits  URINE MICROSCOPIC-ADD ON - Abnormal; Notable for the following:    Squamous Epithelial / LPF MANY (*)    Bacteria, UA FEW (*)    All other components within normal limits  HCG, QUANTITATIVE, PREGNANCY - Abnormal; Notable for the following:    hCG, Beta Chain, Quant, S 123 (*)    All other components  within normal limits  URINE CULTURE  ACETAMINOPHEN LEVEL  CBC  ETHANOL  TSH    Imaging Review No results found.   EKG Interpretation   Date/Time:  Tuesday April 10 2014 13:19:59 EDT Ventricular Rate:  85 PR Interval:  114 QRS Duration: 79 QT Interval:  403 QTC Calculation: 479 R Axis:   49 Text Interpretation:  Sinus rhythm Borderline short PR interval Borderline  prolonged QT interval ED PHYSICIAN INTERPRETATION AVAILABLE IN [facility] Confirmed by TEST, Record ([id number]) on 04/12/2014 7:16:55 AM      MDM   Final diagnoses:  Pregnancy  Delusional disorder  Scabies    Pt showered and treated with elimite. + Pregnancy test.  Quant HCG 125.  Pt without AP or VB. No indication for imaging or further testing re: early pregnancy.  Pt G5P4. Last delivery 12 weeks ago. No VB for over 8 weeks.  Pt calmer.  Oriented. Lucid. Still anxious, but better.  Awaiting formal Psych eval.  Medically cleat for eval.    Rolland Porter, MD 04/14/14 2332

## 2014-04-10 NOTE — ED Notes (Signed)
Pt transferred from main ed, presents with AV hallucinations, seeing childhood over& over again and hearing voices telling her to kill the devil, sees devil also.  Denies SI, pt in early stages of pregnancy.  Diagnosed with Bipolar DO in past.  Pt being treated for scabies at present, transferred to [facility] after 2nd treatment.  Must take shower at 2345 tonight.

## 2014-04-10 NOTE — ED Notes (Signed)
Pt made aware will be treated for scabies. She was aware of DX upon arrival by EDP James. Pt continues to repeat herself and continues to inquire about tx. Repeated several times plan of care. Pt calm at present

## 2014-04-10 NOTE — BH Assessment (Signed)
Pt's referrals were faxed to the following hospitals:  [facility]  [facility]  [facility]  [facility]  [facility]   - Sheritha Louis, MA   Disposition [facility] 

## 2014-04-10 NOTE — ED Notes (Signed)
Per Dr. Fayrene Fearing, pt will need a bath 8 hours after application, 2345 tonight.

## 2014-04-10 NOTE — ED Notes (Signed)
MD at bedside. EDP JAMES PRESENT TO REEVALUATE THIS PT

## 2014-04-10 NOTE — ED Notes (Addendum)
Per EMS: Pt has been off meds x 1 week, c/o SI/HI w/o plan and anxiety. Pt also having auditory hallucination about someone saying devil.

## 2014-04-10 NOTE — Consult Note (Signed)
  Review of Systems  Unable to perform ROS: acuity of condition    

## 2014-04-10 NOTE — ED Notes (Signed)
Asked pt for urine sample she states unable to provide one at this time. 

## 2014-04-10 NOTE — ED Notes (Signed)
Pt was made aware that curtain could not be closed due to SI, curtain was opened. Pt then got up and opened curtain stating she could not do that because she would be anxious. Explained to pt why and pt states "I am not SI now". MD and nurse made aware.

## 2014-04-10 NOTE — ED Notes (Signed)
Asked pt for urine sample she states unable to provide one at this time.

## 2014-04-11 DIAGNOSIS — F1910 Other psychoactive substance abuse, uncomplicated: Secondary | ICD-10-CM

## 2014-04-11 DIAGNOSIS — R45851 Suicidal ideations: Secondary | ICD-10-CM

## 2014-04-11 DIAGNOSIS — F29 Unspecified psychosis not due to a substance or known physiological condition: Secondary | ICD-10-CM

## 2014-04-11 MED ORDER — DIPHENHYDRAMINE-ZINC ACETATE 2-0.1 % EX CREA
TOPICAL_CREAM | Freq: Every day | CUTANEOUS | Status: DC | PRN
  Administered 2014-04-11: 14:00:00 via TOPICAL
  Filled 2014-04-11: qty 28

## 2014-04-11 MED ORDER — CEPHALEXIN 500 MG PO CAPS
500.0000 mg | ORAL_CAPSULE | Freq: Two times a day (BID) | ORAL | Status: DC
  Administered 2014-04-11: 500 mg via ORAL
  Filled 2014-04-11: qty 1

## 2014-04-11 NOTE — ED Notes (Signed)
Patient up to shower. Room sanitized.

## 2014-04-11 NOTE — Consult Note (Signed)
[place] [facility] Consult   Reason for Consult:  Psychosis and suicidal thoughts Referring Physician:  ER MD  Gabrielle Baker is an 28 y.o. female. Total Time spent with patient: 25 minutes  Assessment: AXIS I:  Psychotic Disorder NOS              Polysubstance abuse AXIS II:  Deferred AXIS III:   Past Medical History  Diagnosis Date  . No pertinent past medical history   . Polysubstance abuse   . pregnant    AXIS IV:  chronic mental illness AXIS V:  41-50 serious symptoms  Plan:  Recommend psychiatric Inpatient admission when medically cleared.  Subjective:   Gabrielle Baker is a 28 y.o. female patient admitted with psychotic thinking.  HPI:  Gabrielle Baker is seen and her chart reviewed. Patient continues to endorse suicidal thoughts and psychosis. She says she has been hearing voices telling her to hurt herself ad sometimes the voices tells her  to pray. Patient tearful, anxious. Patient  says she has heard voices all her life and has never received any treatment for psychosis in the past.  Her records indicate otherwise and the accompanying notes say she has been off her medication for one week.  Consequently, she cannot be considered a reliable historian. She said " I feel like a whole bunch of people are trying to hurt me".  She thought we were making jokes and laughing at her.  When asked if she lived alone or with somebody else, she said"with God".  She was insistent she be given something now to make her better but it was unclear as to whether she was talking about the itching or the psychosis. Patient urine toxicology is positive for opiates, cocaine and Marijuana. Her Urine pregnancy test is also positive. She received one time treatment for scabies yesterday.  HPI Elements:   Location:  psychotic thinking. Quality:  hearing voices and paranoia is present. Severity:  afraid to go home. Timing:  no noted precipitants. Duration:  similar episode in 2013 when admitted to  [facility]. Context:  as above.  Past Psychiatric History: Past Medical History  Diagnosis Date  . No pertinent past medical history   . Polysubstance abuse   . Mental disorder   . Depression   . Anxiety     reports that she has been smoking Cigarettes.  She has a 2.5 pack-year smoking history. She does not have any smokeless tobacco history on file. She reports that she uses illicit drugs (Marijuana, "Crack" cocaine, and Cocaine). She reports that she does not drink alcohol. Family History  Problem Relation Age of Onset  . Diabetes Maternal Grandmother   . Heart disease Neg Hx   . Hypertension Neg Hx   . Stroke Neg Hx            Allergies:   Allergies  Allergen Reactions  . Acetaminophen Diarrhea    Patient states she tolerates percocet    ACT Assessment Complete:  Yes:    Educational Status    Risk to Self: Risk to self with the past 6 months Is patient at risk for suicide?: Yes Substance abuse history and/or treatment for substance abuse?: Yes  Risk to Others:    Abuse:    Prior Inpatient Therapy:    Prior Outpatient Therapy:    Additional Information:                    Objective: Blood pressure 124/62, pulse 80, temperature 97 F (  36.1 C), temperature source Oral, resp. rate 16, SpO2 99.00%, unknown if currently breastfeeding.There is no weight on file to calculate BMI. Results for orders placed during the hospital encounter of 04/10/14 (from the past 72 hour(s))  TSH     Status: None   Collection Time    04/10/14 11:03 AM      Result Value Ref Range   TSH 0.718  0.350 - 4.500 uIU/mL   Comment: Performed at [place]: None   Collection Time    04/10/14 11:05 AM      Result Value Ref Range   Acetaminophen (Tylenol), Serum <15.0  10 - 30 ug/mL   Comment:            THERAPEUTIC CONCENTRATIONS VARY     SIGNIFICANTLY. A RANGE OF 10-30     ug/mL MAY BE AN EFFECTIVE     CONCENTRATION FOR MANY PATIENTS.      HOWEVER, SOME ARE BEST TREATED     AT CONCENTRATIONS OUTSIDE THIS     RANGE.     ACETAMINOPHEN CONCENTRATIONS     >150 ug/mL AT 4 HOURS AFTER     INGESTION AND >50 ug/mL AT 12     HOURS AFTER INGESTION ARE     OFTEN ASSOCIATED WITH TOXIC     REACTIONS.  CBC     Status: None   Collection Time    04/10/14 11:05 AM      Result Value Ref Range   WBC 9.2  4.0 - 10.5 K/uL   RBC 4.59  3.87 - 5.11 MIL/uL   Hemoglobin 13.5  12.0 - 15.0 g/dL   HCT 40.3  36.0 - 46.0 %   MCV 87.8  78.0 - 100.0 fL   MCH 29.4  26.0 - 34.0 pg   MCHC 33.5  30.0 - 36.0 g/dL   RDW 14.8  11.5 - 15.5 %   Platelets 269  150 - 400 K/uL  COMPREHENSIVE METABOLIC PANEL     Status: Abnormal   Collection Time    04/10/14 11:05 AM      Result Value Ref Range   Sodium 137  137 - 147 mEq/L   Potassium 3.5 (*) 3.7 - 5.3 mEq/L   Chloride 99  96 - 112 mEq/L   CO2 24  19 - 32 mEq/L   Glucose, Bld 125 (*) 70 - 99 mg/dL   BUN 14  6 - 23 mg/dL   Creatinine, Ser 0.85  0.50 - 1.10 mg/dL   Calcium 9.6  8.4 - 10.5 mg/dL   Total Protein 8.4 (*) 6.0 - 8.3 g/dL   Albumin 4.5  3.5 - 5.2 g/dL   AST 16  0 - 37 U/L   ALT 14  0 - 35 U/L   Alkaline Phosphatase 66  39 - 117 U/L   Total Bilirubin 0.6  0.3 - 1.2 mg/dL   GFR calc non Af Amer >90  >90 mL/min   GFR calc Af Amer >90  >90 mL/min   Comment: (NOTE)     The eGFR has been calculated using the CKD EPI equation.     This calculation has not been validated in all clinical situations.     eGFR's persistently <90 mL/min signify possible Chronic Kidney     Disease.   Anion gap 14  5 - 15  ETHANOL     Status: None   Collection Time    04/10/14 11:05 AM  Result Value Ref Range   Alcohol, Ethyl (B) <11  0 - 11 mg/dL   Comment:            LOWEST DETECTABLE LIMIT FOR     SERUM ALCOHOL IS 11 mg/dL     FOR MEDICAL PURPOSES ONLY  SALICYLATE LEVEL     Status: Abnormal   Collection Time    04/10/14 11:05 AM      Result Value Ref Range   Salicylate Lvl <0.8 (*) 2.8 - 20.0 mg/dL   URINALYSIS, ROUTINE W REFLEX MICROSCOPIC     Status: Abnormal   Collection Time    04/10/14  1:26 PM      Result Value Ref Range   Color, Urine AMBER (*) YELLOW   Comment: BIOCHEMICALS MAY BE AFFECTED BY COLOR   APPearance TURBID (*) CLEAR   Specific Gravity, Urine 1.033 (*) 1.005 - 1.030   pH 6.0  5.0 - 8.0   Glucose, UA NEGATIVE  NEGATIVE mg/dL   Hgb urine dipstick NEGATIVE  NEGATIVE   Bilirubin Urine SMALL (*) NEGATIVE   Ketones, ur 15 (*) NEGATIVE mg/dL   Protein, ur 30 (*) NEGATIVE mg/dL   Urobilinogen, UA 1.0  0.0 - 1.0 mg/dL   Nitrite NEGATIVE  NEGATIVE   Leukocytes, UA MODERATE (*) NEGATIVE  PREGNANCY, URINE     Status: Abnormal   Collection Time    04/10/14  1:26 PM      Result Value Ref Range   Preg Test, Ur POSITIVE (*) NEGATIVE   Comment:            THE SENSITIVITY OF THIS     METHODOLOGY IS >20 mIU/mL.  URINE MICROSCOPIC-ADD ON     Status: Abnormal   Collection Time    04/10/14  1:26 PM      Result Value Ref Range   Squamous Epithelial / LPF MANY (*) RARE   WBC, UA 11-20  <3 WBC/hpf   Bacteria, UA FEW (*) RARE   Urine-Other MUCOUS PRESENT    URINE RAPID DRUG SCREEN (HOSP PERFORMED)     Status: Abnormal   Collection Time    04/10/14  1:27 PM      Result Value Ref Range   Opiates POSITIVE (*) NONE DETECTED   Cocaine POSITIVE (*) NONE DETECTED   Benzodiazepines NONE DETECTED  NONE DETECTED   Amphetamines NONE DETECTED  NONE DETECTED   Tetrahydrocannabinol POSITIVE (*) NONE DETECTED   Barbiturates NONE DETECTED  NONE DETECTED   Comment:            DRUG SCREEN FOR MEDICAL PURPOSES     ONLY.  IF CONFIRMATION IS NEEDED     FOR ANY PURPOSE, NOTIFY LAB     WITHIN 5 DAYS.                LOWEST DETECTABLE LIMITS     FOR URINE DRUG SCREEN     Drug Class       Cutoff (ng/mL)     Amphetamine      1000     Barbiturate      200     Benzodiazepine   811     Tricyclics       031     Opiates          300     Cocaine          300     THC  50  HCG,  QUANTITATIVE, PREGNANCY     Status: Abnormal   Collection Time    04/10/14  2:22 PM      Result Value Ref Range   hCG, Beta Chain, Quant, S 123 (*) <5 mIU/mL   Comment:              GEST. AGE      CONC.  (mIU/mL)       <=1 WEEK        5 - 50         2 WEEKS       50 - 500         3 WEEKS       100 - 10,000         4 WEEKS     1,000 - 30,000         5 WEEKS     3,500 - 115,000       6-8 WEEKS     12,000 - 270,000        12 WEEKS     15,000 - 220,000                FEMALE AND NON-PREGNANT FEMALE:         LESS THAN 5 mIU/mL   Labs are reviewed and are pertinent for positive cocaine, THC and opiates  Current Facility-Administered Medications  Medication Dose Route Frequency Provider Last Rate Last Dose  . hydrOXYzine (ATARAX/VISTARIL) tablet 25 mg  25 mg Oral TID PRN Tanna Furry, MD   25 mg at 04/11/14 0953  . hydrOXYzine (ATARAX/VISTARIL) tablet 25 mg  25 mg Oral TID PRN Tanna Furry, MD   25 mg at 04/10/14 2210  . ondansetron (ZOFRAN) tablet 4 mg  4 mg Oral Q8H PRN Tanna Furry, MD      . ziprasidone (GEODON) injection 20 mg  20 mg Intramuscular Q6H PRN Tanna Furry, MD   20 mg at 04/10/14 1300   Current Outpatient Prescriptions  Medication Sig Dispense Refill  . albuterol (PROVENTIL HFA;VENTOLIN HFA) 108 (90 BASE) MCG/ACT inhaler Inhale 1-2 puffs into the lungs every 6 (six) hours as needed for wheezing.  1 Inhaler  0  . lamoTRIgine (LAMICTAL) 150 MG tablet Take 150 mg by mouth 3 (three) times daily.      Marland Kitchen OLANZapine (ZYPREXA) 20 MG tablet Take 20 mg by mouth at bedtime.      . sertraline (ZOLOFT) 50 MG tablet Take 50 mg by mouth daily.        Psychiatric Specialty Exam:     Blood pressure 124/62, pulse 80, temperature 97 F (36.1 C), temperature source Oral, resp. rate 16, SpO2 99.00%, unknown if currently breastfeeding.There is no weight on file to calculate BMI.  General Appearance: Disheveled  Eye Sport and exercise psychologist::  Fair  Speech:  Clear and Coherent  Volume:  Normal  Mood:  Irritable   Affect:  Labile  Thought Process:  Coherent  Orientation:  Full (Time, Place, and Person)  Thought Content:  Hallucinations: Auditory and Paranoid Ideation  Suicidal Thoughts:  Yes.  without intent/plan  Homicidal Thoughts:  No  Memory:  Immediate;   Fair Recent;   Fair Remote;   Fair  Judgement:  Impaired  Insight:  Lacking  Psychomotor Activity:  Normal  Concentration:  Fair  Recall:  [employer] of Knowledge:Good  Language: Good  Akathisia:  Negative  Handed:  Right  AIMS (if indicated):     Assets:  Communication Skills  Sleep:      Musculoskeletal: Strength & Muscle Tone: within normal limits Gait & Station: normal Patient leans: N/A  Treatment Plan Summary: Daily contact with patient to assess and evaluate symptoms and progress in treatment Medication management Still seeking  inpatient bed for treatment of psychosis  Corena Pilgrim, MD 04/11/2014 10:05 AM

## 2014-04-11 NOTE — BH Assessment (Signed)
[facility] Assessment Progress Note 04/11/14 Per Thayer Ohm, Accepted at [facility] by Dr. Kris Hartmann. Should go through [employer]. Report # is [phone].

## 2014-04-11 NOTE — Discharge Instructions (Signed)
Psychosis Psychosis refers to a severe lack of understanding with reality. During a psychotic episode, you are not able to think clearly. During a psychotic episode, your responses and emotions are inappropriate and do not coincide with what is actually happening. You often have false beliefs about what is happening or who you are (delusions), and you may see, hear, taste, smell, or feel things that are not present (hallucinations). Psychosis is usually a severe symptom of a very serious mental health (psychiatric) condition, but it can sometimes be the result of a medical condition. CAUSES   Psychiatric conditions, such as:  Schizophrenia.  Bipolar disorder.  Depression.  Personality disorders.  Alcohol or drug abuse.  Medical conditions, such as:  Brain injury.  Brain tumor.  Dementia.  Brain diseases, such as Alzheimer's, Parkinson's, or Huntington's disease.  Neurological diseases, such as epilepsy.  Genetic disorders.  Metabolic disorders.  Infections that affect the brain.  Certain prescription drugs.  Stroke. SYMPTOMS   Unable to think or speak clearly or respond appropriately.  Disorganized thinking (thoughts jump from one thought to another).  Severe inappropriate behavior.  Delusions may include:  A strong belief that is odd, unrealistic, or false.  Feeling extremely fearful or suspicious (paranoid).  Believing you are someone else, have high importance, or have an altered identity.  Hallucinations. DIAGNOSIS   Mental health evaluation.  Physical exam.  Blood tests.  Computerized magnetic scan (MRI) or other brain scans. TREATMENT  Your caregiver will recommend a course of treatment that depends on the cause of the psychosis. Treatment may include:  Monitoring and supportive care in the hospital.  Taking medicines (antipsychotic medicine) to reduce symptoms and balance chemicals in the brain.  Taking medicines to manage underlying  mental health conditions.  Therapy and other supportive programs outside of the hospital.  Treating an underlying medical condition. If the cause of the psychosis can be treated or corrected, the outlook is good. Without treatment, psychotic episodes can cause danger to yourself or others. Treatment may be short-term or lifelong. HOME CARE INSTRUCTIONS   Take all medicines as directed. This is important.  Use a pillbox or write down your medicine schedule to make sure you are taking them.  Check with your caregiver before using over-the-counter medicines, herbs, or supplements.  Seek individual and family support through therapy and mental health education (psychoeducation) programs. These will help you manage symptoms and side effects of medicines, learn life skills, and maintain a healthy routine.  Maintain a healthy lifestyle.  Exercise regularly.  Avoid alcohol and drugs.  Learn ways to reduce stress and cope with stress, such as yoga and meditation.  Talk about your feelings with family members or caregivers.  Make time for yourself to do things you enjoy.  Know the early warning signs of psychosis. Your caregiver will recommend steps to take when you notice symptoms such as:  Feeling anxious or preoccupied.  Having racing thoughts.  Changes in your interest in life and relationships.  Follow up with your caregivers for continued outpatient treatment as directed. SEEK MEDICAL CARE IF:   Medicines do not seem to be helping.  You hear voices telling you to do things.  You see, smell, or feel things that are not there.  You feel hopeless and overwhelmed.  You feel extremely fearful and suspicious that something will harm you.  You feel like you cannot leave your house.  You have trouble taking care of yourself.  You experience side effects of medicines, such as   changes in sleep patterns, dizziness, weight gain, restlessness, movement changes, muscle spasms, or  tremors. SEEK IMMEDIATE MEDICAL CARE IF:  Severe psychotic symptoms present a safety issue (such as an urge to hurt yourself or others). MAKE SURE YOU:   Understand these instructions.  Will watch your condition.  Will get help right away if you are not doing well or get worse. FOR MORE INFORMATION  National Institute of Mental Health: www.nimh.nih.gov Document Released: 01/07/2010 Document Revised: 10/12/2011 Document Reviewed: 01/07/2010 ExitCare Patient Information 2015 [employer]. This information is not intended to replace advice given to you by your health care provider. Make sure you discuss any questions you have with your health care provider.  

## 2014-04-11 NOTE — ED Notes (Signed)
Unable to give nurse report at this time.  Facility requests call beck in half hour.

## 2014-04-24 ENCOUNTER — Inpatient Hospital Stay (HOSPITAL_COMMUNITY): Payer: Medicaid Other

## 2014-04-24 ENCOUNTER — Inpatient Hospital Stay (HOSPITAL_COMMUNITY)
Admission: AD | Admit: 2014-04-24 | Discharge: 2014-04-24 | Disposition: A | Discharge status: Home / Self Care | Payer: Medicaid Other | Source: Ambulatory Visit | Attending: Obstetrics & Gynecology | Admitting: Obstetrics & Gynecology

## 2014-04-24 ENCOUNTER — Encounter (HOSPITAL_COMMUNITY): Payer: Self-pay

## 2014-04-24 DIAGNOSIS — O039 Complete or unspecified spontaneous abortion without complication: Secondary | ICD-10-CM | POA: Insufficient documentation

## 2014-04-24 DIAGNOSIS — F329 Major depressive disorder, single episode, unspecified: Secondary | ICD-10-CM | POA: Diagnosis not present

## 2014-04-24 DIAGNOSIS — O209 Hemorrhage in early pregnancy, unspecified: Secondary | ICD-10-CM | POA: Diagnosis present

## 2014-04-24 DIAGNOSIS — F17200 Nicotine dependence, unspecified, uncomplicated: Secondary | ICD-10-CM | POA: Insufficient documentation

## 2014-04-24 DIAGNOSIS — F1410 Cocaine abuse, uncomplicated: Secondary | ICD-10-CM | POA: Insufficient documentation

## 2014-04-24 DIAGNOSIS — F3289 Other specified depressive episodes: Secondary | ICD-10-CM | POA: Insufficient documentation

## 2014-04-24 LAB — URINALYSIS, ROUTINE W REFLEX MICROSCOPIC
BILIRUBIN URINE: NEGATIVE
Glucose, UA: NEGATIVE mg/dL
Ketones, ur: NEGATIVE mg/dL
Nitrite: NEGATIVE
PH: 7 (ref 5.0–8.0)
Protein, ur: NEGATIVE mg/dL
Specific Gravity, Urine: 1.01 (ref 1.005–1.030)
Urobilinogen, UA: 0.2 mg/dL (ref 0.0–1.0)

## 2014-04-24 LAB — CBC
HCT: 37.6 % (ref 36.0–46.0)
Hemoglobin: 12.8 g/dL (ref 12.0–15.0)
MCH: 29.2 pg (ref 26.0–34.0)
MCHC: 34 g/dL (ref 30.0–36.0)
MCV: 85.8 fL (ref 78.0–100.0)
PLATELETS: 308 10*3/uL (ref 150–400)
RBC: 4.38 MIL/uL (ref 3.87–5.11)
RDW: 15 % (ref 11.5–15.5)
WBC: 10.2 10*3/uL (ref 4.0–10.5)

## 2014-04-24 LAB — HCG, QUANTITATIVE, PREGNANCY: hCG, Beta Chain, Quant, S: 9877 m[IU]/mL — ABNORMAL HIGH (ref <5)

## 2014-04-24 LAB — RAPID URINE DRUG SCREEN, HOSP PERFORMED
AMPHETAMINES: NOT DETECTED
BARBITURATES: NOT DETECTED
Benzodiazepines: NOT DETECTED
Cocaine: POSITIVE — AB
Opiates: NOT DETECTED
TETRAHYDROCANNABINOL: NOT DETECTED

## 2014-04-24 LAB — URINE MICROSCOPIC-ADD ON

## 2014-04-24 LAB — POCT PREGNANCY, URINE: PREG TEST UR: POSITIVE — AB

## 2014-04-24 NOTE — MAU Note (Signed)
Patient states she was seen at [facility] on  Sunday and diagnosed with twins at 8 weeks,. States she started bleeding today and having bad abdominal cramping.

## 2014-04-24 NOTE — Progress Notes (Signed)
Pt back to U/S upon radiologist request.

## 2014-04-24 NOTE — Progress Notes (Signed)
Pt. Discharged without accepting discharge papers.  Pt. Did receive discharge instructions from K. Glyn Ade, PA.  Pt. Stated that she will not be returning for her follow-up visit.  Pt. Encouraged to keep appt. And was stressed about the importance of keeping her follow up appt.
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
## 2014-04-24 NOTE — Discharge Instructions (Signed)
Miscarriage °A miscarriage is the loss of an unborn baby (fetus) before the 20th week of pregnancy. The cause is often unknown.  °HOME CARE °· You may need to stay in bed (bed rest), or you may be able to do light activity. Go about activity as told by your doctor. °· Have help at home. °· Write down how many pads you use each day. Write down how soaked they are. °· Do not use tampons. Do not wash out your vagina (douche) or have sex (intercourse) until your doctor approves. °· Only take medicine as told by your doctor. °· Do not take aspirin. °· Keep all doctor visits as told. °· If you or your partner have problems with grieving, talk to your doctor. You can also try counseling. Give yourself time to grieve before trying to get pregnant again. °GET HELP RIGHT AWAY IF: °· You have bad cramps or pain in your back or belly (abdomen). °· You have a fever. °· You pass large clumps of blood (clots) from your vagina that are walnut-sized or larger. Save the clumps for your doctor to see. °· You pass large amounts of tissue from your vagina. Save the tissue for your doctor to see. °· You have more bleeding. °· You have thick, bad-smelling fluid (discharge) coming from the vagina. °· You get lightheaded, weak, or you pass out (faint). °· You have chills. °MAKE SURE YOU: °· Understand these instructions. °· Will watch your condition. °· Will get help right away if you are not doing well or get worse. °Document Released: 10/12/2011 Document Reviewed: 10/12/2011 °ExitCare® Patient Information ©2015 [employer]. This information is not intended to replace advice given to you by your health care provider. Make sure you discuss any questions you have with your health care provider. ° °

## 2014-04-24 NOTE — Progress Notes (Signed)
During patient's stay in [facility]U, patient was intermittently angry, using foul language, arguing with FOB, complaining about wait time for U/S, results, etc. Then would apologize. At discharge, patient was angry, stating she would not return for follow-up lab. Discussed importance of follow-up, encouraged patient to return as advised.

## 2014-04-24 NOTE — MAU Note (Signed)
Pt. States that she has had moderate vaginal bleeding last night and today.  Pt. States that cramping just started today.

## 2014-06-04 ENCOUNTER — Encounter (HOSPITAL_COMMUNITY): Payer: Self-pay

## 2014-06-24 ENCOUNTER — Emergency Department (HOSPITAL_COMMUNITY): Payer: Self-pay

## 2014-06-24 ENCOUNTER — Emergency Department (HOSPITAL_COMMUNITY)
Admission: EM | Admit: 2014-06-24 | Discharge: 2014-06-24 | Disposition: A | Discharge status: Home / Self Care | Payer: Self-pay | Attending: Emergency Medicine | Admitting: Emergency Medicine

## 2014-06-24 ENCOUNTER — Encounter (HOSPITAL_COMMUNITY): Payer: Self-pay

## 2014-06-24 ENCOUNTER — Emergency Department (HOSPITAL_COMMUNITY)
Admission: EM | Admit: 2014-06-24 | Discharge: 2014-06-24 | Discharge status: Absent without leave | Payer: Medicaid Other | Attending: Emergency Medicine | Admitting: Emergency Medicine

## 2014-06-24 DIAGNOSIS — Y998 Other external cause status: Secondary | ICD-10-CM | POA: Insufficient documentation

## 2014-06-24 DIAGNOSIS — Z23 Encounter for immunization: Secondary | ICD-10-CM | POA: Insufficient documentation

## 2014-06-24 DIAGNOSIS — Y9389 Activity, other specified: Secondary | ICD-10-CM | POA: Insufficient documentation

## 2014-06-24 DIAGNOSIS — S93402A Sprain of unspecified ligament of left ankle, initial encounter: Secondary | ICD-10-CM | POA: Insufficient documentation

## 2014-06-24 DIAGNOSIS — S0001XA Abrasion of scalp, initial encounter: Secondary | ICD-10-CM | POA: Insufficient documentation

## 2014-06-24 DIAGNOSIS — S0093XA Contusion of unspecified part of head, initial encounter: Secondary | ICD-10-CM

## 2014-06-24 DIAGNOSIS — Z590 Homelessness: Secondary | ICD-10-CM | POA: Insufficient documentation

## 2014-06-24 DIAGNOSIS — S0033XA Contusion of nose, initial encounter: Secondary | ICD-10-CM | POA: Insufficient documentation

## 2014-06-24 DIAGNOSIS — S9002XA Contusion of left ankle, initial encounter: Secondary | ICD-10-CM | POA: Insufficient documentation

## 2014-06-24 DIAGNOSIS — T07XXXA Unspecified multiple injuries, initial encounter: Secondary | ICD-10-CM

## 2014-06-24 DIAGNOSIS — S7011XA Contusion of right thigh, initial encounter: Secondary | ICD-10-CM | POA: Insufficient documentation

## 2014-06-24 DIAGNOSIS — S60416A Abrasion of right little finger, initial encounter: Secondary | ICD-10-CM | POA: Insufficient documentation

## 2014-06-24 DIAGNOSIS — S5011XA Contusion of right forearm, initial encounter: Secondary | ICD-10-CM | POA: Insufficient documentation

## 2014-06-24 DIAGNOSIS — S9032XA Contusion of left foot, initial encounter: Secondary | ICD-10-CM | POA: Insufficient documentation

## 2014-06-24 DIAGNOSIS — S0990XA Unspecified injury of head, initial encounter: Secondary | ICD-10-CM

## 2014-06-24 DIAGNOSIS — S0003XA Contusion of scalp, initial encounter: Secondary | ICD-10-CM | POA: Insufficient documentation

## 2014-06-24 DIAGNOSIS — S60419A Abrasion of unspecified finger, initial encounter: Secondary | ICD-10-CM

## 2014-06-24 DIAGNOSIS — N939 Abnormal uterine and vaginal bleeding, unspecified: Secondary | ICD-10-CM | POA: Insufficient documentation

## 2014-06-24 DIAGNOSIS — S63502A Unspecified sprain of left wrist, initial encounter: Secondary | ICD-10-CM | POA: Insufficient documentation

## 2014-06-24 DIAGNOSIS — S5012XA Contusion of left forearm, initial encounter: Secondary | ICD-10-CM | POA: Insufficient documentation

## 2014-06-24 DIAGNOSIS — S40022A Contusion of left upper arm, initial encounter: Secondary | ICD-10-CM | POA: Insufficient documentation

## 2014-06-24 DIAGNOSIS — S7012XA Contusion of left thigh, initial encounter: Secondary | ICD-10-CM | POA: Insufficient documentation

## 2014-06-24 DIAGNOSIS — Y9289 Other specified places as the place of occurrence of the external cause: Secondary | ICD-10-CM | POA: Insufficient documentation

## 2014-06-24 DIAGNOSIS — S40021A Contusion of right upper arm, initial encounter: Secondary | ICD-10-CM | POA: Insufficient documentation

## 2014-06-24 MED ORDER — IBUPROFEN 200 MG PO TABS
600.0000 mg | ORAL_TABLET | Freq: Once | ORAL | Status: AC
  Administered 2014-06-24: 600 mg via ORAL

## 2014-06-24 MED ORDER — NAPROXEN 500 MG PO TABS: 500.0000 mg | ORAL_TABLET | Freq: Two times a day (BID) | ORAL | Status: DC

## 2014-06-24 MED ORDER — IBUPROFEN 200 MG PO TABS
600.0000 mg | ORAL_TABLET | Freq: Once | ORAL | Status: AC
  Administered 2014-06-24: 600 mg via ORAL
  Filled 2014-06-24: qty 3

## 2014-06-24 MED ORDER — TETANUS-DIPHTH-ACELL PERTUSSIS 5-2.5-18.5 LF-MCG/0.5 IM SUSP
0.5000 mL | Freq: Once | INTRAMUSCULAR | Status: AC
  Administered 2014-06-24: 0.5 mL via INTRAMUSCULAR

## 2014-06-24 MED ORDER — TETANUS-DIPHTH-ACELL PERTUSSIS 5-2.5-18.5 LF-MCG/0.5 IM SUSP
0.5000 mL | Freq: Once | INTRAMUSCULAR | Status: AC
  Administered 2014-06-24: 0.5 mL via INTRAMUSCULAR
  Filled 2014-06-24: qty 0.5

## 2014-06-24 NOTE — ED Notes (Signed)
MD at bedside. EDPA PRESENT TO EVALUATE THIS PT 

## 2014-06-24 NOTE — ED Notes (Addendum)
Attempted to gather more information from patient in order to administer medication. She is sharing conflicting name and birthday and states "I just hurt I don't want to talk". Importance of providing accurate information to maintain patient safety.

## 2014-06-24 NOTE — ED Notes (Signed)
Chart being merged with MRN [id number]

## 2014-06-24 NOTE — ED Notes (Signed)
Pt denies SI or HI .  
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
## 2014-06-24 NOTE — Discharge Instructions (Signed)
Take naprosyn for pain. All your x-rays returned normal. Ice your head and your ankle. Wear the brace for support. Crutches as needed. Follow up with your doctor.    Ankle Sprain An ankle sprain is an injury to the strong, fibrous tissues (ligaments) that hold the bones of your ankle joint together.  CAUSES An ankle sprain is usually caused by a fall or by twisting your ankle. Ankle sprains most commonly occur when you step on the outer edge of your foot, and your ankle turns inward. People who participate in sports are more prone to these types of injuries.  SYMPTOMS   Pain in your ankle. The pain may be present at rest or only when you are trying to stand or walk.  Swelling.  Bruising. Bruising may develop immediately or within 1 to 2 days after your injury.  Difficulty standing or walking, particularly when turning corners or changing directions. DIAGNOSIS  Your caregiver will ask you details about your injury and perform a physical exam of your ankle to determine if you have an ankle sprain. During the physical exam, your caregiver will press on and apply pressure to specific areas of your foot and ankle. Your caregiver will try to move your ankle in certain ways. An X-ray exam may be done to be sure a bone was not broken or a ligament did not separate from one of the bones in your ankle (avulsion fracture).  TREATMENT  Certain types of braces can help stabilize your ankle. Your caregiver can make a recommendation for this. Your caregiver may recommend the use of medicine for pain. If your sprain is severe, your caregiver may refer you to a surgeon who helps to restore function to parts of your skeletal system (orthopedist) or a physical therapist. HOME CARE INSTRUCTIONS   Apply ice to your injury for 1-2 days or as directed by your caregiver. Applying ice helps to reduce inflammation and pain.  Put ice in a plastic bag.  Place a towel between your skin and the bag.  Leave the ice on  for 15-20 minutes at a time, every 2 hours while you are awake.  Only take over-the-counter or prescription medicines for pain, discomfort, or fever as directed by your caregiver.  Elevate your injured ankle above the level of your heart as much as possible for 2-3 days.  If your caregiver recommends crutches, use them as instructed. Gradually put weight on the affected ankle. Continue to use crutches or a cane until you can walk without feeling pain in your ankle.  If you have a plaster splint, wear the splint as directed by your caregiver. Do not rest it on anything harder than a pillow for the first 24 hours. Do not put weight on it. Do not get it wet. You may take it off to take a shower or bath.  You may have been given an elastic bandage to wear around your ankle to provide support. If the elastic bandage is too tight (you have numbness or tingling in your foot or your foot becomes cold and blue), adjust the bandage to make it comfortable.  If you have an air splint, you may blow more air into it or let air out to make it more comfortable. You may take your splint off at night and before taking a shower or bath. Wiggle your toes in the splint several times per day to decrease swelling. SEEK MEDICAL CARE IF:   You have rapidly increasing bruising or swelling.  Your  toes feel extremely cold or you lose feeling in your foot.  Your pain is not relieved with medicine. SEEK IMMEDIATE MEDICAL CARE IF:  Your toes are numb or blue.  You have severe pain that is increasing. MAKE SURE YOU:   Understand these instructions.  Will watch your condition.  Will get help right away if you are not doing well or get worse. Document Released: 07/20/2005 Document Revised: 04/13/2012 Document Reviewed: 08/01/2011 [facility] Patient Information 2015 [place], [place]. This information is not intended to replace advice given to you by your health care provider. Make sure you discuss any questions you have  with your health care provider. Head Injury You have received a head injury. It does not appear serious at this time. Headaches and vomiting are common following head injury. It should be easy to awaken from sleeping. Sometimes it is necessary for you to stay in the emergency department for a while for observation. Sometimes admission to the hospital may be needed. After injuries such as yours, most problems occur within the first 24 hours, but side effects may occur up to 7-10 days after the injury. It is important for you to carefully monitor your condition and contact your health care provider or seek immediate medical care if there is a change in your condition. WHAT ARE THE TYPES OF HEAD INJURIES? Head injuries can be as minor as a bump. Some head injuries can be more severe. More severe head injuries include:  A jarring injury to the brain (concussion).  A bruise of the brain (contusion). This mean there is bleeding in the brain that can cause swelling.  A cracked skull (skull fracture).  Bleeding in the brain that collects, clots, and forms a bump (hematoma). WHAT CAUSES A HEAD INJURY? A serious head injury is most likely to happen to someone who is in a car wreck and is not wearing a seat belt. Other causes of major head injuries include bicycle or motorcycle accidents, sports injuries, and falls. HOW ARE HEAD INJURIES DIAGNOSED? A complete history of the event leading to the injury and your current symptoms will be helpful in diagnosing head injuries. Many times, pictures of the brain, such as CT or MRI are needed to see the extent of the injury. Often, an overnight hospital stay is necessary for observation.  WHEN SHOULD I SEEK IMMEDIATE MEDICAL CARE?  You should get help right away if:  You have confusion or drowsiness.  You feel sick to your stomach (nauseous) or have continued, forceful vomiting.  You have dizziness or unsteadiness that is getting worse.  You have severe,  continued headaches not relieved by medicine. Only take over-the-counter or prescription medicines for pain, fever, or discomfort as directed by your health care provider.  You do not have normal function of the arms or legs or are unable to walk.  You notice changes in the black spots in the center of the colored part of your eye (pupil).  You have a clear or bloody fluid coming from your nose or ears.  You have a loss of vision. During the next 24 hours after the injury, you must stay with someone who can watch you for the warning signs. This person should contact local emergency services (911 in the [place] if you have seizures, you become unconscious, or you are unable to wake up. HOW CAN I PREVENT A HEAD INJURY IN THE FUTURE? The most important factor for preventing major head injuries is avoiding motor vehicle accidents. To minimize the  potential for damage to your head, it is crucial to wear seat belts while riding in motor vehicles. Wearing helmets while bike riding and playing collision sports (like football) is also helpful. Also, avoiding dangerous activities around the house will further help reduce your risk of head injury.  WHEN CAN I RETURN TO NORMAL ACTIVITIES AND ATHLETICS? You should be reevaluated by your health care provider before returning to these activities. If you have any of the following symptoms, you should not return to activities or contact sports until 1 week after the symptoms have stopped:  Persistent headache.  Dizziness or vertigo.  Poor attention and concentration.  Confusion.  Memory problems.  Nausea or vomiting.  Fatigue or tire easily.  Irritability.  Intolerant of bright lights or loud noises.  Anxiety or depression.  Disturbed sleep. MAKE SURE YOU:   Understand these instructions.  Will watch your condition.  Will get help right away if you are not doing well or get worse. Document Released: 07/20/2005 Document Revised: 07/25/2013  Document Reviewed: 03/27/2013 [facility] Patient Information 2015 [place], [place]. This information is not intended to replace advice given to you by your health care provider. Make sure you discuss any questions you have with your health care provider.

## 2014-06-24 NOTE — ED Notes (Signed)
Pt's denies SI or HI.
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
## 2014-06-24 NOTE — ED Notes (Signed)
Difficult to teach discharge instructions due to patient not wanting to look up.

## 2014-06-24 NOTE — ED Notes (Signed)
Patient transported to CT 
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
## 2014-06-24 NOTE — ED Notes (Signed)
Ortho at bedside.
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
## 2014-06-24 NOTE — ED Notes (Signed)
Pt requesting to call mother. Phone provided.

## 2014-06-24 NOTE — ED Notes (Signed)
Pt in XRAY 
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
## 2014-06-28 ENCOUNTER — Inpatient Hospital Stay (HOSPITAL_COMMUNITY): Payer: Self-pay

## 2014-06-28 ENCOUNTER — Encounter (HOSPITAL_COMMUNITY): Payer: Self-pay

## 2014-06-28 ENCOUNTER — Inpatient Hospital Stay (HOSPITAL_COMMUNITY)
Admission: AD | Admit: 2014-06-28 | Discharge: 2014-06-29 | Disposition: A | Discharge status: Home / Self Care | Payer: Medicaid Other | Source: Ambulatory Visit | Attending: Obstetrics & Gynecology | Admitting: Obstetrics & Gynecology

## 2014-06-28 DIAGNOSIS — F329 Major depressive disorder, single episode, unspecified: Secondary | ICD-10-CM

## 2014-06-28 DIAGNOSIS — Z720 Tobacco use: Secondary | ICD-10-CM | POA: Insufficient documentation

## 2014-06-28 DIAGNOSIS — Z3202 Encounter for pregnancy test, result negative: Secondary | ICD-10-CM | POA: Insufficient documentation

## 2014-06-28 DIAGNOSIS — N72 Inflammatory disease of cervix uteri: Secondary | ICD-10-CM

## 2014-06-28 DIAGNOSIS — F32A Depression, unspecified: Secondary | ICD-10-CM

## 2014-06-28 LAB — POCT PREGNANCY, URINE: Preg Test, Ur: NEGATIVE

## 2014-06-28 LAB — URINALYSIS, ROUTINE W REFLEX MICROSCOPIC
Bilirubin Urine: NEGATIVE
Glucose, UA: NEGATIVE mg/dL
KETONES UR: 15 mg/dL — AB
NITRITE: NEGATIVE
PROTEIN: NEGATIVE mg/dL
Specific Gravity, Urine: 1.02 (ref 1.005–1.030)
Urobilinogen, UA: 1 mg/dL (ref 0.0–1.0)
pH: 6.5 (ref 5.0–8.0)

## 2014-06-28 LAB — URINE MICROSCOPIC-ADD ON

## 2014-06-28 LAB — CBC
HEMATOCRIT: 35.7 % — AB (ref 36.0–46.0)
Hemoglobin: 11.7 g/dL — ABNORMAL LOW (ref 12.0–15.0)
MCH: 28.9 pg (ref 26.0–34.0)
MCHC: 32.8 g/dL (ref 30.0–36.0)
MCV: 88.1 fL (ref 78.0–100.0)
PLATELETS: 405 10*3/uL — AB (ref 150–400)
RBC: 4.05 MIL/uL (ref 3.87–5.11)
RDW: 14.4 % (ref 11.5–15.5)
WBC: 9.4 10*3/uL (ref 4.0–10.5)

## 2014-06-28 LAB — WET PREP, GENITAL
Trich, Wet Prep: NONE SEEN
Yeast Wet Prep HPF POC: NONE SEEN

## 2014-06-28 LAB — HCG, QUANTITATIVE, PREGNANCY: hCG, Beta Chain, Quant, S: <1 m[IU]/mL (ref <5)

## 2014-06-28 MED ORDER — DM-GUAIFENESIN ER 30-600 MG PO TB12
1.0000 | ORAL_TABLET | Freq: Two times a day (BID) | ORAL | Status: DC
  Administered 2014-06-28: 1 via ORAL
  Filled 2014-06-28 (×2): qty 1

## 2014-06-28 NOTE — MAU Note (Signed)
Here to check if she's pregnant; No period since September when she had a miscarriage. Vaginal irritation, discharge with odor x 3 days. Denies vaginal bleeding. Lower abdominal pain x 3 days. Positive home pregnancy test last week.
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
## 2014-06-29 LAB — RAPID URINE DRUG SCREEN, HOSP PERFORMED
AMPHETAMINES: NOT DETECTED
Barbiturates: NOT DETECTED
Benzodiazepines: NOT DETECTED
COCAINE: POSITIVE — AB
OPIATES: POSITIVE — AB
Tetrahydrocannabinol: POSITIVE — AB

## 2014-06-29 LAB — HIV ANTIBODY (ROUTINE TESTING W REFLEX): HIV 1&2 Ab, 4th Generation: NONREACTIVE

## 2014-06-29 MED ORDER — KETOROLAC TROMETHAMINE 60 MG/2ML IM SOLN
60.0000 mg | Freq: Once | INTRAMUSCULAR | Status: AC
  Administered 2014-06-29: 60 mg via INTRAMUSCULAR
  Filled 2014-06-29: qty 2

## 2014-06-29 MED ORDER — AZITHROMYCIN 250 MG PO TABS
1000.0000 mg | ORAL_TABLET | Freq: Once | ORAL | Status: AC
  Administered 2014-06-29: 1000 mg via ORAL
  Filled 2014-06-29: qty 4

## 2014-06-29 MED ORDER — CEFTRIAXONE SODIUM 250 MG IJ SOLR
250.0000 mg | Freq: Once | INTRAMUSCULAR | Status: AC
  Administered 2014-06-29: 250 mg via INTRAMUSCULAR
  Filled 2014-06-29: qty 250

## 2014-06-29 NOTE — Discharge Instructions (Signed)
Cervicitis °Cervicitis is a soreness and swelling (inflammation) of the cervix. Your cervix is located at the bottom of your uterus. It opens up to the vagina. °CAUSES  °· Sexually transmitted infections (STIs).   °· Allergic reaction.   °· Medicines or birth control devices that are put in the vagina.   °· Injury to the cervix.   °· Bacterial infections.   °RISK FACTORS °You are at greater risk if you: °· Have unprotected sexual intercourse. °· Have sexual intercourse with many partners. °· Began sexual intercourse at an early age. °· Have a history of STIs. °SYMPTOMS  °There may be no symptoms. If symptoms occur, they may include:  °· Gray, white, yellow, or bad-smelling vaginal discharge.   °· Pain or itching of the area outside the vagina.   °· Painful sexual intercourse.   °· Lower abdominal or lower back pain, especially during intercourse.   °· Frequent urination.   °· Abnormal vaginal bleeding between periods, after sexual intercourse, or after menopause.   °· Pressure or a heavy feeling in the pelvis.   °DIAGNOSIS  °Diagnosis is made after a pelvic exam. Other tests may include:  °· Examination of any discharge under a microscope (wet prep).   °· A Pap test.   °TREATMENT  °Treatment will depend on the cause of cervicitis. If it is caused by an STI, both you and your partner will need to be treated. Antibiotic medicines will be given.  °HOME CARE INSTRUCTIONS  °· Do not have sexual intercourse until your health care provider says it is okay.   °· Do not have sexual intercourse until your partner has been treated, if your cervicitis is caused by an STI.   °· Take your antibiotics as directed. Finish them even if you start to feel better.   °SEEK MEDICAL CARE IF: °· Your symptoms come back.   °· You have a fever.   °MAKE SURE YOU:  °· Understand these instructions. °· Will watch your condition. °· Will get help right away if you are not doing well or get worse. °Document Released: 07/20/2005 Document Revised:  07/25/2013 Document Reviewed: 01/11/2013 °[employer]® Patient Information ©2015 [employer]. This information is not intended to replace advice given to you by your health care provider. Make sure you discuss any questions you have with your health care provider. ° °

## 2014-06-30 LAB — GC/CHLAMYDIA PROBE AMP
CT Probe RNA: POSITIVE — AB
GC PROBE AMP APTIMA: POSITIVE — AB

## 2014-07-02 ENCOUNTER — Encounter (HOSPITAL_COMMUNITY): Payer: Self-pay | Admitting: *Deleted

## 2014-10-18 ENCOUNTER — Emergency Department (HOSPITAL_COMMUNITY): Payer: Self-pay

## 2014-10-18 ENCOUNTER — Inpatient Hospital Stay (HOSPITAL_COMMUNITY)
Admission: EM | Admit: 2014-10-18 | Discharge: 2014-10-19 | DRG: 071 | Discharge status: Against Medical Advice | Payer: Self-pay | Attending: Internal Medicine | Admitting: Internal Medicine

## 2014-10-18 ENCOUNTER — Encounter (HOSPITAL_COMMUNITY): Payer: Self-pay | Admitting: *Deleted

## 2014-10-18 ENCOUNTER — Inpatient Hospital Stay (HOSPITAL_COMMUNITY): Payer: Self-pay

## 2014-10-18 DIAGNOSIS — R4182 Altered mental status, unspecified: Secondary | ICD-10-CM

## 2014-10-18 DIAGNOSIS — G9340 Encephalopathy, unspecified: Principal | ICD-10-CM | POA: Diagnosis present

## 2014-10-18 DIAGNOSIS — F419 Anxiety disorder, unspecified: Secondary | ICD-10-CM | POA: Diagnosis present

## 2014-10-18 DIAGNOSIS — F05 Delirium due to known physiological condition: Secondary | ICD-10-CM

## 2014-10-18 DIAGNOSIS — D72829 Elevated white blood cell count, unspecified: Secondary | ICD-10-CM | POA: Diagnosis present

## 2014-10-18 DIAGNOSIS — R259 Unspecified abnormal involuntary movements: Secondary | ICD-10-CM | POA: Diagnosis present

## 2014-10-18 DIAGNOSIS — R258 Other abnormal involuntary movements: Secondary | ICD-10-CM

## 2014-10-18 DIAGNOSIS — R410 Disorientation, unspecified: Secondary | ICD-10-CM

## 2014-10-18 DIAGNOSIS — F17210 Nicotine dependence, cigarettes, uncomplicated: Secondary | ICD-10-CM | POA: Diagnosis present

## 2014-10-18 DIAGNOSIS — F323 Major depressive disorder, single episode, severe with psychotic features: Secondary | ICD-10-CM | POA: Diagnosis present

## 2014-10-18 DIAGNOSIS — F1290 Cannabis use, unspecified, uncomplicated: Secondary | ICD-10-CM | POA: Diagnosis present

## 2014-10-18 DIAGNOSIS — F1410 Cocaine abuse, uncomplicated: Secondary | ICD-10-CM | POA: Diagnosis present

## 2014-10-18 DIAGNOSIS — Z79899 Other long term (current) drug therapy: Secondary | ICD-10-CM

## 2014-10-18 LAB — I-STAT CG4 LACTIC ACID, ED: Lactic Acid, Venous: 0.65 mmol/L (ref 0.5–2.0)

## 2014-10-18 LAB — CBC
HEMATOCRIT: 31.8 % — AB (ref 36.0–46.0)
HEMOGLOBIN: 10.7 g/dL — AB (ref 12.0–15.0)
MCH: 29.8 pg (ref 26.0–34.0)
MCHC: 33.6 g/dL (ref 30.0–36.0)
MCV: 88.6 fL (ref 78.0–100.0)
PLATELETS: 220 10*3/uL (ref 150–400)
RBC: 3.59 MIL/uL — ABNORMAL LOW (ref 3.87–5.11)
RDW: 14.8 % (ref 11.5–15.5)
WBC: 14.1 10*3/uL — AB (ref 4.0–10.5)

## 2014-10-18 LAB — URINALYSIS, ROUTINE W REFLEX MICROSCOPIC
GLUCOSE, UA: NEGATIVE mg/dL
KETONES UR: 40 mg/dL — AB
Nitrite: NEGATIVE
PH: 5.5 (ref 5.0–8.0)
Protein, ur: 100 mg/dL — AB
SPECIFIC GRAVITY, URINE: 1.027 (ref 1.005–1.030)
Urobilinogen, UA: 1 mg/dL (ref 0.0–1.0)

## 2014-10-18 LAB — CK: Total CK: 2606 U/L — ABNORMAL HIGH (ref 7–177)

## 2014-10-18 LAB — CBC WITH DIFFERENTIAL/PLATELET
BASOS PCT: 0 % (ref 0–1)
Basophils Absolute: 0 10*3/uL (ref 0.0–0.1)
EOS ABS: 0 10*3/uL (ref 0.0–0.7)
EOS PCT: 0 % (ref 0–5)
HCT: 34.2 % — ABNORMAL LOW (ref 36.0–46.0)
HEMOGLOBIN: 11.6 g/dL — AB (ref 12.0–15.0)
Lymphocytes Relative: 3 % — ABNORMAL LOW (ref 12–46)
Lymphs Abs: 0.8 10*3/uL (ref 0.7–4.0)
MCH: 29.8 pg (ref 26.0–34.0)
MCHC: 33.9 g/dL (ref 30.0–36.0)
MCV: 87.9 fL (ref 78.0–100.0)
MONO ABS: 1.8 10*3/uL — AB (ref 0.1–1.0)
MONOS PCT: 8 % (ref 3–12)
NEUTROS PCT: 89 % — AB (ref 43–77)
Neutro Abs: 20.9 10*3/uL — ABNORMAL HIGH (ref 1.7–7.7)
PLATELETS: 331 10*3/uL (ref 150–400)
RBC: 3.89 MIL/uL (ref 3.87–5.11)
RDW: 14.9 % (ref 11.5–15.5)
WBC: 23.6 10*3/uL — ABNORMAL HIGH (ref 4.0–10.5)

## 2014-10-18 LAB — COMPREHENSIVE METABOLIC PANEL
ALBUMIN: 5.1 g/dL (ref 3.5–5.2)
ALT: 41 U/L — ABNORMAL HIGH (ref 0–35)
ANION GAP: 16 — AB (ref 5–15)
AST: 85 U/L — ABNORMAL HIGH (ref 0–37)
Alkaline Phosphatase: 80 U/L (ref 39–117)
BUN: 20 mg/dL (ref 6–23)
CHLORIDE: 99 mmol/L (ref 96–112)
CO2: 22 mmol/L (ref 19–32)
CREATININE: 1.04 mg/dL (ref 0.50–1.10)
Calcium: 8.8 mg/dL (ref 8.4–10.5)
GFR calc Af Amer: 84 mL/min — ABNORMAL LOW (ref >90)
GFR calc non Af Amer: 72 mL/min — ABNORMAL LOW (ref >90)
Glucose, Bld: 62 mg/dL — ABNORMAL LOW (ref 70–99)
Potassium: 3.5 mmol/L (ref 3.5–5.1)
SODIUM: 137 mmol/L (ref 135–145)
Total Bilirubin: 1.5 mg/dL — ABNORMAL HIGH (ref 0.3–1.2)
Total Protein: 8.5 g/dL — ABNORMAL HIGH (ref 6.0–8.3)

## 2014-10-18 LAB — URINE MICROSCOPIC-ADD ON

## 2014-10-18 LAB — RAPID URINE DRUG SCREEN, HOSP PERFORMED
Amphetamines: NOT DETECTED
BARBITURATES: NOT DETECTED
BENZODIAZEPINES: NOT DETECTED
Cocaine: POSITIVE — AB
Opiates: POSITIVE — AB
TETRAHYDROCANNABINOL: POSITIVE — AB

## 2014-10-18 LAB — PROTIME-INR
INR: 1 (ref 0.00–1.49)
Prothrombin Time: 13.3 seconds (ref 11.6–15.2)

## 2014-10-18 LAB — CBG MONITORING, ED: GLUCOSE-CAPILLARY: 110 mg/dL — AB (ref 70–99)

## 2014-10-18 LAB — CREATININE, SERUM
Creatinine, Ser: 0.72 mg/dL (ref 0.50–1.10)
GFR calc non Af Amer: >90 mL/min (ref >90)

## 2014-10-18 LAB — PREGNANCY, URINE: Preg Test, Ur: NEGATIVE

## 2014-10-18 LAB — ETHANOL

## 2014-10-18 MED ORDER — LORAZEPAM 2 MG/ML IJ SOLN
2.0000 mg | Freq: Once | INTRAMUSCULAR | Status: DC
  Filled 2014-10-18 (×2): qty 1

## 2014-10-18 MED ORDER — DEXTROSE 5 % IV SOLN
2.0000 g | Freq: Once | INTRAVENOUS | Status: AC
  Administered 2014-10-18: 2 g via INTRAVENOUS
  Filled 2014-10-18: qty 2

## 2014-10-18 MED ORDER — LORAZEPAM 2 MG/ML IJ SOLN
INTRAMUSCULAR | Status: AC
  Administered 2014-10-18: 1 mg
  Filled 2014-10-18: qty 1

## 2014-10-18 MED ORDER — DEXAMETHASONE SODIUM PHOSPHATE 10 MG/ML IJ SOLN
10.0000 mg | Freq: Once | INTRAMUSCULAR | Status: AC
  Administered 2014-10-18: 10 mg via INTRAVENOUS
  Filled 2014-10-18: qty 1

## 2014-10-18 MED ORDER — DEXTROSE 50 % IV SOLN
25.0000 mL | Freq: Once | INTRAVENOUS | Status: AC
  Administered 2014-10-18: 25 mL via INTRAVENOUS
  Filled 2014-10-18: qty 50

## 2014-10-18 MED ORDER — BENZTROPINE MESYLATE 1 MG/ML IJ SOLN: 1.0000 mg | Freq: Once | INTRAMUSCULAR | Status: DC

## 2014-10-18 MED ORDER — SODIUM CHLORIDE 0.9 % IV BOLUS (SEPSIS)
1000.0000 mL | Freq: Once | INTRAVENOUS | Status: AC
  Administered 2014-10-18: 1000 mL via INTRAVENOUS

## 2014-10-18 MED ORDER — CEFTRIAXONE SODIUM IN DEXTROSE 40 MG/ML IV SOLN
2.0000 g | Freq: Two times a day (BID) | INTRAVENOUS | Status: DC
  Administered 2014-10-19: 2 g via INTRAVENOUS
  Filled 2014-10-18 (×2): qty 50

## 2014-10-18 MED ORDER — POLYETHYLENE GLYCOL 3350 17 G PO PACK: 17.0000 g | PACK | Freq: Every day | ORAL | Status: DC | PRN

## 2014-10-18 MED ORDER — VANCOMYCIN HCL IN DEXTROSE 750-5 MG/150ML-% IV SOLN
750.0000 mg | Freq: Three times a day (TID) | INTRAVENOUS | Status: DC
  Administered 2014-10-18 – 2014-10-19 (×2): 750 mg via INTRAVENOUS
  Filled 2014-10-18 (×3): qty 150

## 2014-10-18 MED ORDER — HEPARIN SODIUM (PORCINE) 5000 UNIT/ML IJ SOLN
5000.0000 [IU] | Freq: Three times a day (TID) | INTRAMUSCULAR | Status: DC
  Administered 2014-10-18 – 2014-10-19 (×2): 5000 [IU] via SUBCUTANEOUS
  Filled 2014-10-18 (×2): qty 1

## 2014-10-18 MED ORDER — ONDANSETRON HCL 4 MG PO TABS: 4.0000 mg | ORAL_TABLET | Freq: Four times a day (QID) | ORAL | Status: DC | PRN

## 2014-10-18 MED ORDER — LORAZEPAM 2 MG/ML IJ SOLN
2.0000 mg | Freq: Once | INTRAMUSCULAR | Status: AC
  Administered 2014-10-18: 2 mg via INTRAVENOUS
  Filled 2014-10-18: qty 1

## 2014-10-18 MED ORDER — ONDANSETRON HCL 4 MG/2ML IJ SOLN: 4.0000 mg | Freq: Four times a day (QID) | INTRAMUSCULAR | Status: DC | PRN

## 2014-10-18 MED ORDER — SODIUM CHLORIDE 0.9 % IJ SOLN: 3.0000 mL | Freq: Two times a day (BID) | INTRAMUSCULAR | Status: DC

## 2014-10-18 MED ORDER — DEXTROSE-NACL 5-0.45 % IV SOLN
INTRAVENOUS | Status: DC
  Administered 2014-10-18: 20:00:00 via INTRAVENOUS

## 2014-10-18 MED ORDER — DEXTROSE 5 % IV SOLN
10.0000 mg/kg | Freq: Three times a day (TID) | INTRAVENOUS | Status: DC
  Administered 2014-10-18 – 2014-10-19 (×2): 570 mg via INTRAVENOUS
  Filled 2014-10-18 (×4): qty 11.4

## 2014-10-18 MED ORDER — VANCOMYCIN HCL 10 G IV SOLR
1500.0000 mg | INTRAVENOUS | Status: AC
  Administered 2014-10-18: 1500 mg via INTRAVENOUS
  Filled 2014-10-18: qty 1500

## 2014-10-18 MED ORDER — LIDOCAINE-PRILOCAINE 2.5-2.5 % EX CREA
TOPICAL_CREAM | Freq: Once | CUTANEOUS | Status: AC
  Administered 2014-10-18: 16:00:00 via TOPICAL
  Filled 2014-10-18: qty 5

## 2014-10-18 NOTE — ED Notes (Signed)
Radiology unable to do DG Lumbar Puncture--- pt was very combative and uncooperative;  EDP was made aware---- stated it was okay not to have it at this time.
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
## 2014-10-18 NOTE — ED Notes (Signed)
Bed: WA11 Expected date:  Expected time:  Means of arrival:  Comments: EMS 

## 2014-10-18 NOTE — ED Provider Notes (Signed)
CSN: [id number]     Arrival date & time 10/18/14  1232 History   First MD Initiated Contact with Patient 10/18/14 1301     Chief Complaint  Patient presents with  . Involuntary Movements      The history is provided by the patient and the EMS personnel. No language interpreter was used.   Gabrielle Baker presents for evaluation of involuntary movements and agitation. Level 5 caveat due to confusion. Per reports she was agitated and had involuntary movement at home and there is a chaotic scene. Patient says that she had an argument with her husband over a 3 some. When she became upset she started moving involuntarily allover her body and feels very uncomfortable. She denies any recent illness or ingestion. She denies any fevers but does feel hot. She has no vomiting, chest pain, shortness of breath, no pain, diarrhea. Symptoms are moderate and constant.  Past Medical History  Diagnosis Date  . No pertinent past medical history   . Polysubstance abuse   . Mental disorder   . Depression   . Anxiety    Past Surgical History  Procedure Laterality Date  . No past surgeries     Family History  Problem Relation Age of Onset  . Diabetes Maternal Grandmother   . Heart disease Neg Hx   . Hypertension Neg Hx   . Stroke Neg Hx    History  Substance Use Topics  . Smoking status: Current Every Day Smoker -- 0.50 packs/day for 10 years    Types: Cigarettes  . Smokeless tobacco: Not on file  . Alcohol Use: No   OB History    Gravida Para Term Preterm AB TAB SAB Ectopic Multiple Living   0 2 0 2 0 0 4     Review of Systems  All other systems reviewed and are negative.     Allergies  Acetaminophen  Home Medications   Prior to Admission medications   Medication Sig Start Date End Date Taking? Authorizing Provider  albuterol (PROVENTIL HFA;VENTOLIN HFA) 108 (90 BASE) MCG/ACT inhaler Inhale 1-2 puffs into the lungs every 6 (six) hours as needed for wheezing. 03/25/13  Yes April  Palumbo, MD  benztropine (COGENTIN) 1 MG tablet Take 1 mg by mouth 3 (three) times daily.   Yes Historical Provider, MD  lamoTRIgine (LAMICTAL) 25 MG tablet Take 50 mg by mouth daily.   Yes Historical Provider, MD  prenatal vitamin w/FE, FA (PRENATAL 1 + 1) 27-1 MG TABS tablet Take 2 tablets by mouth daily at 12 noon.   Yes Historical Provider, MD  risperiDONE (RISPERDAL) 2 MG tablet Take 2 mg by mouth daily.   Yes Historical Provider, MD  sertraline (ZOLOFT) 50 MG tablet Take 50 mg by mouth daily.   Yes Historical Provider, MD   BP 117/53 mmHg  Pulse 129  Temp(Src) 100.1 F (37.8 C) (Oral)  Resp 20  SpO2 96%  LMP 04/24/2013 Physical Exam  Constitutional: She is oriented to person, place, and time. She appears well-developed and well-nourished.  HENT:  Head: Normocephalic and atraumatic.  Dry mucous membranes  Cardiovascular: Regular rhythm.   No murmur heard. Tachycardic  Pulmonary/Chest: Effort normal and breath sounds normal. No respiratory distress.  Abdominal: Soft. There is no tenderness. There is no rebound and no guarding.  Musculoskeletal: She exhibits no edema or tenderness.  Neurological: She is alert and oriented to person, place, and time.  Skin: Skin is warm and dry.  Psychiatric:  Anxious  appearing with large persistent dancing movements of arms and legs and frequent lipsmacking  Nursing note and vitals reviewed.   ED Course  LUMBAR PUNCTURE Date/Time: 10/18/2014 4:05 PM Performed by: Tilden Fossa Authorized by: Tilden Fossa Consent: The procedure was performed in an emergent situation. Verbal consent not obtained. Imaging studies: imaging studies available Patient identity confirmed: arm band Time out: Immediately prior to procedure a "time out" was called to verify the correct patient, procedure, equipment, support staff and site/side marked as required. Indications: evaluation for infection and evaluation for altered mental status Anesthesia: local  infiltration Local anesthetic: lidocaine 1% without epinephrine Anesthetic total: 2 ml Patient sedated: no Preparation: Patient was prepped and draped in the usual sterile fashion. Lumbar space: L3-L4 interspace Patient's position: left lateral decubitus Needle gauge: 20 Needle length: 3.5 in Comments: Unable to obtain CSF after three attempts   (including critical care time) Labs Review Labs Reviewed  COMPREHENSIVE METABOLIC PANEL - Abnormal; Notable for the following:    Glucose, Bld 62 (*)    Total Protein 8.5 (*)    AST 85 (*)    ALT 41 (*)    Total Bilirubin 1.5 (*)    GFR calc non Af Amer 72 (*)    GFR calc Af Amer 84 (*)    Anion gap 16 (*)    All other components within normal limits  CBC WITH DIFFERENTIAL/PLATELET - Abnormal; Notable for the following:    WBC 23.6 (*)    Hemoglobin 11.6 (*)    HCT 34.2 (*)    Neutrophils Relative % 89 (*)    Neutro Abs 20.9 (*)    Lymphocytes Relative 3 (*)    Monocytes Absolute 1.8 (*)    All other components within normal limits  URINALYSIS, ROUTINE W REFLEX MICROSCOPIC - Abnormal; Notable for the following:    APPearance CLOUDY (*)    Hgb urine dipstick MODERATE (*)    Bilirubin Urine SMALL (*)    Ketones, ur 40 (*)    Protein, ur 100 (*)    Leukocytes, UA TRACE (*)    All other components within normal limits  URINE RAPID DRUG SCREEN (HOSP PERFORMED) - Abnormal; Notable for the following:    Opiates POSITIVE (*)    Cocaine POSITIVE (*)    Tetrahydrocannabinol POSITIVE (*)    All other components within normal limits  URINE MICROSCOPIC-ADD ON - Abnormal; Notable for the following:    Bacteria, UA FEW (*)    Casts HYALINE CASTS (*)    All other components within normal limits  URINE CULTURE  CULTURE, BLOOD (ROUTINE X 2)  CULTURE, BLOOD (ROUTINE X 2)  PREGNANCY, URINE  ETHANOL  I-STAT CG4 LACTIC ACID, ED    Imaging Review Ct Head Wo Contrast  10/18/2014   CLINICAL DATA:  Altered mental status. Agitated.  Involuntary movements. Alert and oriented.  EXAM: CT HEAD WITHOUT CONTRAST  TECHNIQUE: Contiguous axial images were obtained from the base of the skull through the vertex without intravenous contrast.  COMPARISON:  None.  FINDINGS: There is no evidence of mass effect, midline shift or extra-axial fluid collections. There is no evidence of a space-occupying lesion or intracranial hemorrhage. There is no evidence of a cortical-based area of acute infarction.  The ventricles and sulci are appropriate for the patient's age. The basal cisterns are patent.  Visualized portions of the orbits are unremarkable. The visualized portions of the paranasal sinuses and mastoid air cells are unremarkable.  The osseous structures are unremarkable.  IMPRESSION: Normal CT of the brain without intravenous contrast.   Electronically Signed   By: Elige KoHetal  Patel   On: 10/18/2014 15:12     EKG Interpretation None      MDM   Final diagnoses:  Acute confusional state   Pt here with increased motor activity and agitation - question medication reaction vs ingestion. Initial exam pt appeared to have tardive diskinesia type movement.  After ativan pt sleeping comfortable but arousable.  Concern for possible meningitis given the increased temperature, leukocytosis, degree of somnolence following Ativan administration, treating empirically for potential meningitis pending LP.  D/w Hospitalist who agrees to admit - recommends consult to neuro for LP.  D/w Dr. Amada JupiterKirkpatrick re LP - recommends IR.  Plan to obtain LP under IR.      Tilden FossaElizabeth Jiayi Lengacher, MD 10/19/14 [phone]

## 2014-10-18 NOTE — ED Notes (Signed)
Per EMS - pt comes from home with c/o involuntary movement and agitation.  Report on scene was chaotic.  Patient admitted to use of cocaine and taking a pill that is not her's @9 :30 this morning.  Patient reports involuntary movements started this morning.  Patient unreliable historian and EMS reports scene was chaotic.  Patient is A&O x4 and aware of situation. Vitals on scene, HR 130 palp, BP 160/100.  CBG 89.

## 2014-10-18 NOTE — ED Notes (Signed)
Late entry:  On arrival to ED, patient was constantly moving her arms, legs and torso.  Sitting up, laying back down, rotating her head in a manner similar to severe tardive dyskinesia.  Stream of consciousness reporting.  Patient states she was trying to sleep when her husband/boyfriend woke her up because he was going to have sex with another woman.  Patient states she didn't mind that, but she was angry he woke her up.  Patient states her partner then started telling her she was "fucking crazy and need to leave."  Patient states this agitated her even more.  Patient states she took one pill from her partner's pill bottle - she believed it to be Zyprexa.  Patient admits to smoking marijuana earlier today.  Patient denies use of cocaine or other illicit substances.  Patient begging for water or ice.  Patient keeps removing her gown and masturbating in bed.  Patient has bruises in various stages of healing on her arms and legs as well as several scratches on arms and torso.  Pupils sluggish 3mm.  +3 radial pulses.  No LE edema noted.

## 2014-10-18 NOTE — Progress Notes (Signed)
ANTIBIOTIC CONSULT NOTE - INITIAL  Pharmacy Consult for vancomycin Indication: Meningitis  Allergies  Allergen Reactions  . Acetaminophen Diarrhea    Patient states she tolerates percocet    Patient Measurements:   Adjusted Body Weight:   Vital Signs: Temp: 100.1 F (37.8 C) (03/17 1234) Temp Source: Oral (03/17 1234) BP: 117/53 mmHg (03/17 1234) Pulse Rate: 129 (03/17 1234) Intake/Output from previous day:   Intake/Output from this shift:    Labs:  Recent Labs  10/18/14 1353  WBC 23.6*  HGB 11.6*  PLT 331   CrCl cannot be calculated (Unknown ideal weight.). No results for input(s): VANCOTROUGH, VANCOPEAK, VANCORANDOM, GENTTROUGH, GENTPEAK, GENTRANDOM, TOBRATROUGH, TOBRAPEAK, TOBRARND, AMIKACINPEAK, AMIKACINTROU, AMIKACIN in the last 72 hours.   Microbiology: No results found for this or any previous visit (from the past 720 hour(s)).  Medical History: Past Medical History  Diagnosis Date  . No pertinent past medical history   . Polysubstance abuse   . Mental disorder   . Depression   . Anxiety    Assessment: 28 YOF presents with involuntary movements, agitation and confusion.  Pharmacy asked to dose vancomycin for rule out meningitis.  Lumbar puncture to be performed. IV dexamethasone ordered.  3/17 >> vancomycin  >> 3/17 >> ceftriaxone x 1    Tmax: 100.1 WBCs: elevated Renal: SCr WNL, normalized CrCl = 3294ml/min  3/17 blood: 3/17 urine:   Last documented weight is ~67kg from 06/2014   Goal of Therapy:  Vancomycin trough level 15-20 mcg/ml  Plan:   Vancomycin 1500mg  IV x 1 then 750mg  IV q8h  Check vancomycin trough if remains on vancomycin > 48hr  Follow renal function  Ceftriaxone 2gm x1 ordered, follow-up additional doses as appropriate  Follow-up CSF analysis and culture results  Juliette Alcideustin Zeigler, PharmD, BCPS.   Pager: [phone]  10/18/2014,2:36 PM

## 2014-10-18 NOTE — ED Notes (Signed)
MD at bedside with patient

## 2014-10-19 ENCOUNTER — Other Ambulatory Visit: Payer: Self-pay | Admitting: Diagnostic Radiology

## 2014-10-19 ENCOUNTER — Inpatient Hospital Stay (HOSPITAL_COMMUNITY): Payer: Medicaid Other

## 2014-10-19 DIAGNOSIS — F1410 Cocaine abuse, uncomplicated: Secondary | ICD-10-CM

## 2014-10-19 LAB — CBC
HEMATOCRIT: 32 % — AB (ref 36.0–46.0)
HEMOGLOBIN: 10.8 g/dL — AB (ref 12.0–15.0)
MCH: 30.2 pg (ref 26.0–34.0)
MCHC: 33.8 g/dL (ref 30.0–36.0)
MCV: 89.4 fL (ref 78.0–100.0)
Platelets: 266 10*3/uL (ref 150–400)
RBC: 3.58 MIL/uL — ABNORMAL LOW (ref 3.87–5.11)
RDW: 15 % (ref 11.5–15.5)
WBC: 12.2 10*3/uL — ABNORMAL HIGH (ref 4.0–10.5)

## 2014-10-19 LAB — BASIC METABOLIC PANEL
ANION GAP: 10 (ref 5–15)
BUN: 10 mg/dL (ref 6–23)
CHLORIDE: 100 mmol/L (ref 96–112)
CO2: 25 mmol/L (ref 19–32)
CREATININE: 0.65 mg/dL (ref 0.50–1.10)
Calcium: 8.7 mg/dL (ref 8.4–10.5)
GFR calc non Af Amer: >90 mL/min (ref >90)
GLUCOSE: 97 mg/dL (ref 70–99)
Potassium: 3.5 mmol/L (ref 3.5–5.1)
Sodium: 135 mmol/L (ref 135–145)

## 2014-10-19 LAB — GRAM STAIN

## 2014-10-19 LAB — CSF CELL COUNT WITH DIFFERENTIAL
RBC COUNT CSF: 6 /mm3 — AB
Tube #: 1
WBC, CSF: 3 /mm3 (ref 0–5)

## 2014-10-19 LAB — GLUCOSE, CSF: Glucose, CSF: 76 mg/dL (ref 43–76)

## 2014-10-19 LAB — PROTEIN, CSF: Total  Protein, CSF: 34 mg/dL (ref 15–45)

## 2014-10-19 MED ORDER — RISPERIDONE 0.5 MG PO TABS: 2.0000 mg | ORAL_TABLET | Freq: Every day | ORAL | Status: DC

## 2014-10-19 MED ORDER — SODIUM CHLORIDE 0.9 % IV BOLUS (SEPSIS)
1000.0000 mL | Freq: Once | INTRAVENOUS | Status: AC
  Administered 2014-10-19: 1000 mL via INTRAVENOUS

## 2014-10-19 MED ORDER — SERTRALINE HCL 50 MG PO TABS: 50.0000 mg | ORAL_TABLET | Freq: Every day | ORAL | Status: DC

## 2014-10-19 MED ORDER — LORAZEPAM 2 MG/ML IJ SOLN
0.5000 mg | Freq: Once | INTRAMUSCULAR | Status: AC
  Administered 2014-10-19: 0.5 mg via INTRAVENOUS

## 2014-10-19 MED ORDER — LORAZEPAM 2 MG/ML IJ SOLN
INTRAMUSCULAR | Status: AC
  Filled 2014-10-19: qty 1

## 2014-10-19 MED ORDER — DEXTROSE-NACL 5-0.45 % IV SOLN
INTRAVENOUS | Status: DC
  Administered 2014-10-19: 11:00:00 via INTRAVENOUS

## 2014-10-19 MED ORDER — ALBUTEROL SULFATE (2.5 MG/3ML) 0.083% IN NEBU: 3.0000 mL | INHALATION_SOLUTION | Freq: Four times a day (QID) | RESPIRATORY_TRACT | Status: DC | PRN

## 2014-10-19 MED ORDER — BENZTROPINE MESYLATE 0.5 MG PO TABS
1.0000 mg | ORAL_TABLET | Freq: Three times a day (TID) | ORAL | Status: DC
  Administered 2014-10-19: 1 mg via ORAL
  Filled 2014-10-19: qty 2

## 2014-10-19 MED ORDER — PRENATAL PLUS 27-1 MG PO TABS
2.0000 | ORAL_TABLET | Freq: Every day | ORAL | Status: DC
  Administered 2014-10-19: 2 via ORAL
  Filled 2014-10-19: qty 2

## 2014-10-19 MED ORDER — LAMOTRIGINE 100 MG PO TABS
50.0000 mg | ORAL_TABLET | Freq: Every day | ORAL | Status: DC
  Administered 2014-10-19: 50 mg via ORAL
  Filled 2014-10-19: qty 0.5

## 2014-10-19 NOTE — Progress Notes (Signed)
Patient continues to grow more and more agitated, pacing the room, insulting staff and the [facility]. Benedetto Coons. Callahan, NP paged again. Order received to give the patient another 0.5 mg of Ativan IV. Will follow orders and continue to monitor.

## 2014-10-19 NOTE — Progress Notes (Signed)
Call received from lab that patient had WBC in spinal fluid that was mononuclear in origin and negative gram stain.  Paged result to MD. Nino Parsleyark, Arcenia Scarbro B

## 2014-10-19 NOTE — Progress Notes (Signed)
Patient arrived back to room from having lumbar puncture. Patient states "when can I leave?', Patient told she needed to lay flat after having lumbar puncture.  Patient verbalized understanding and said "I know I need to lay flat, but I don't want to, call my doctor and tell him I want to leave."  Dr. Robb Matarrtiz notified patient wanting to leave.  Dr. Robb Matarrtiz said he could not discharge her until the results of the lumbar puncture came back.   Patient informed of what Dr. Robb Matarrtiz said and patient said " I still want to leave."  After explaing consequences of leaving against medical advice [facility]) patient stated " I know, but I will still just leave against medical advice."  Dr. Robb Matarrtiz notified about patient leaving AMA.

## 2014-10-19 NOTE — Progress Notes (Signed)
Report received from Melissa Brannan, RN. No change from initial pm assessment. Will continue to monitor and follow the POC.  

## 2014-10-19 NOTE — H&P (Signed)
PROCEDURE: Informed consent was obtained from the patient prior to the procedure, including potential complications of headache, allergy, and pain. With the patient prone, the lower back was prepped with Betadine. 1% Lidocaine was used for local anesthesia. Lumbar puncture was performed at the [L4-5] level using a [21] gauge needle with return of [clear colorless] CSF with an opening pressure of [17] cm water. [Twenty-six] ml of CSF were obtained for laboratory studies. The patient tolerated the procedure well and there were no apparent complications. IMPRESSION: [Spinal tap performed at L4-5 without immediate complication.]   Allayne GitelmanJames H. Jena GaussMaxwell MD

## 2014-10-19 NOTE — Discharge Summary (Addendum)
Physician Discharge Summary  Gabrielle Baker ZOX:[id number] DOB: 02/04/1986 DOA: 10/18/2014  PCP: No PCP Per Patient  Admit date: 10/18/2014 Discharge date: 10/19/2014  Time spent: 35 minutes  LEFT AMA  Discharge Diagnoses:  Active Problems:   Major depressive disorder, single episode, severe with psychotic features   Cocaine abuse   Acute encephalopathy   Involuntary movements    Filed Weights   10/18/14 1853  Weight: 72.3 kg (159 lb 6.3 oz)    History of present illness:  [age] y.o. female past medical history of psychotic disorder, polysubstance abuse who was brought in by no known source for agitation and involuntary movement at her house. Most of the patient is obtained from the chart as there is nobody at bedside and the patient is heavily sedated and cannot provide history. According to the note patient had a argument with husband she became very upset and started moving involuntarily over and felt uncomfortable. The husband denies any recent ingestion or toxic ingestion. He denies she's has not had any fever, vomiting chest pain, shortness of breath or diarrhea.  Hospital Course:  Acute encephalopathy/ Involuntary movements - Encephalopathy involuntarily movements now resolved. - Patient agreed to have LP performed, continue vancomycin and Rocephin and acyclovir. - Leukocytosis is slowly improving she has remained afebrile. - She denies any suicidal ideations or thoughts. She relates she does not remember taking extra medications that are prescribed to her. - After LP done patients wanted to leave the hospital. Risk ans benefit were explained, she relates that she feels better and that she understands the risk. - She was being rational about why she can leave as she was tolerating her diet, was not vomiting, no fever and was feeling better.  Major depressive disorder, single episode, severe with psychotic features - Recent home medications.  Cocaine abuse -  Counseling.  Procedures:  LP   Ct head   Consultations:  IR  Discharge Exam: Filed Vitals:   10/19/14 1339  BP: 116/83  Pulse: 105  Temp: 97.3 F (36.3 C)  Resp: 22    General: See progress note  Discharge Instructions   Discharge Instructions    Diet - low sodium heart healthy    Complete by:  As directed      Increase activity slowly    Complete by:  As directed           Discharge Medication List as of 10/19/2014  3:23 PM    CONTINUE these medications which have NOT CHANGED   Details  albuterol (PROVENTIL HFA;VENTOLIN HFA) 108 (90 BASE) MCG/ACT inhaler Inhale 1-2 puffs into the lungs every 6 (six) hours as needed for wheezing., Starting 03/25/2013, Until Discontinued, Print    benztropine (COGENTIN) 1 MG tablet Take 1 mg by mouth 3 (three) times daily., Until Discontinued, Historical Med    lamoTRIgine (LAMICTAL) 25 MG tablet Take 50 mg by mouth daily., Until Discontinued, Historical Med    prenatal vitamin w/FE, FA (PRENATAL 1 + 1) 27-1 MG TABS tablet Take 2 tablets by mouth daily at 12 noon., Until Discontinued, Historical Med    risperiDONE (RISPERDAL) 2 MG tablet Take 2 mg by mouth daily., Until Discontinued, Historical Med    sertraline (ZOLOFT) 50 MG tablet Take 50 mg by mouth daily., Until Discontinued, Historical Med       Allergies  Allergen Reactions  . Acetaminophen Diarrhea    Patient states she tolerates percocet      The results of significant diagnostics from this hospitalization (including imaging,  microbiology, ancillary and laboratory) are listed below for reference.    Significant Diagnostic Studies: Ct Head Wo Contrast  10/18/2014   CLINICAL DATA:  Altered mental status. Agitated. Involuntary movements. Alert and oriented.  EXAM: CT HEAD WITHOUT CONTRAST  TECHNIQUE: Contiguous axial images were obtained from the base of the skull through the vertex without intravenous contrast.  COMPARISON:  None.  FINDINGS: There is no evidence  of mass effect, midline shift or extra-axial fluid collections. There is no evidence of a space-occupying lesion or intracranial hemorrhage. There is no evidence of a cortical-based area of acute infarction.  The ventricles and sulci are appropriate for the patient's age. The basal cisterns are patent.  Visualized portions of the orbits are unremarkable. The visualized portions of the paranasal sinuses and mastoid air cells are unremarkable.  The osseous structures are unremarkable.  IMPRESSION: Normal CT of the brain without intravenous contrast.   Electronically Signed   By: Elige Ko   On: 10/18/2014 15:12   Dg Fluoro Guide Lumbar Puncture  10/19/2014   CLINICAL DATA:  Altered mental status.  EXAM: DIAGNOSTIC LUMBAR PUNCTURE UNDER FLUOROSCOPIC GUIDANCE  FLUOROSCOPY TIME:  Fluoroscopy Time (in minutes and seconds): 0 minutes 23 seconds  Number of Acquired Images:  2  PROCEDURE: Informed consent was obtained from the patient prior to the procedure, including potential complications of headache, allergy, and pain. With the patient prone, the lower back was prepped with Betadine. 1% Lidocaine was used for local anesthesia. Lumbar puncture was performed at the L4-5 level using a 21 gauge needle with return of clear colorless CSF with an opening pressure of 17 cm water. Twenty-six ml of CSF were obtained for laboratory studies. The patient tolerated the procedure well and there were no apparent complications.  IMPRESSION: Spinal tap performed at L4-5 without immediate complication.   Electronically Signed   By: Francene Boyers M.D.   On: 10/19/2014 14:34    Microbiology: Recent Results (from the past 240 hour(s))  Culture, blood (routine x 2)     Status: None (Preliminary result)   Collection Time: 10/18/14  3:00 PM  Result Value Ref Range Status   Specimen Description BLOOD RIGHT HAND  Final   Special Requests BOTTLES DRAWN AEROBIC AND ANAEROBIC  Final   Culture   Final           BLOOD CULTURE  RECEIVED NO GROWTH TO DATE CULTURE WILL BE HELD FOR 5 DAYS BEFORE ISSUING A FINAL NEGATIVE REPORT Performed at [facility]    Report Status PENDING  Incomplete  Culture, blood (routine x 2)     Status: None (Preliminary result)   Collection Time: 10/18/14  3:08 PM  Result Value Ref Range Status   Specimen Description BLOOD LEFT HAND  Final   Special Requests BOTTLES DRAWN AEROBIC AND ANAEROBIC 5 CC EA  Final   Culture   Final           BLOOD CULTURE RECEIVED NO GROWTH TO DATE CULTURE WILL BE HELD FOR 5 DAYS BEFORE ISSUING A FINAL NEGATIVE REPORT Performed at [facility]    Report Status PENDING  Incomplete  Gram stain     Status: None   Collection Time: 10/19/14 12:45 PM  Result Value Ref Range Status   Specimen Description CSF  Final   Special Requests NONE  Final   Gram Stain   Final    WBC SEEN WBC PRESENT, PREDOMINANTLY MONONUCLEAR NO ORGANISMS SEEN Gram Stain Report Called to,Read Back  By and Verified With: T DARK RN AT 1625 ON 03.18.16 BY G KONTOS CYTOSPIN    Report Status 10/19/2014 FINAL  Final     Labs: Basic Metabolic Panel:  Recent Labs Lab 10/18/14 1353 10/18/14 1959 10/19/14 0715  NA 137  --  135  K 3.5  --  3.5  CL 99  --  100  CO2 22  --  25  GLUCOSE 62*  --  97  BUN 20  --  10  CREATININE 1.04 0.72 0.65  CALCIUM 8.8  --  8.7   Liver Function Tests:  Recent Labs Lab 10/18/14 1353  AST 85*  ALT 41*  ALKPHOS 80  BILITOT 1.5*  PROT 8.5*  ALBUMIN 5.1   No results for input(s): LIPASE, AMYLASE in the last 168 hours. No results for input(s): AMMONIA in the last 168 hours. CBC:  Recent Labs Lab 10/18/14 1353 10/18/14 1959 10/19/14 0715  WBC 23.6* 14.1* 12.2*  NEUTROABS 20.9*  --   --   HGB 11.6* 10.7* 10.8*  HCT 34.2* 31.8* 32.0*  MCV 87.9 88.6 89.4  PLT 331 220 266   Cardiac Enzymes:  Recent Labs Lab 10/18/14 1959  CKTOTAL 2606*   BNP: BNP (last 3 results) No results for input(s): BNP in the last 8760  hours.  ProBNP (last 3 results) No results for input(s): PROBNP in the last 8760 hours.  CBG:  Recent Labs Lab 10/18/14 1643  GLUCAP 110*       Signed:  Marinda Elk  [facility] 10/19/2014, 5:08 PM

## 2014-10-19 NOTE — Progress Notes (Signed)
Call placed to T. Claiborne Billingsallahan, NP. Patient yelling for something to eat, cussing the RN, belligerent, and very agitated. Order received to give patient 0.5mg  of Ativan IV.

## 2014-10-19 NOTE — Progress Notes (Signed)
CARE MANAGEMENT NOTE 10/19/2014  Patient:  Gabrielle Baker,Gabrielle Baker   Account Number:  [id number]  Date Initiated:  10/19/2014  Documentation initiated by:  Trinna BalloonMcGIBBONEY,COOKIE Tirsa Gail  Subjective/Objective Assessment:   pt admitted with confusion and involuntarily movement     Action/Plan:   from home   Anticipated DC Date:  10/21/2014   Anticipated DC Plan:  HOME/SELF CARE      DC Planning Services  CM consult  MATCH Program      Choice offered to / List presented to:             Status of service:  In process, will continue to follow Medicare Important Message given?   (If response is "NO", the following Medicare IM given date fields will be blank) Date Medicare IM given:   Medicare IM given by:   Date Additional Medicare IM given:   Additional Medicare IM given by:    Discharge Disposition:    Per UR Regulation:  Reviewed for med. necessity/level of care/duration of stay  If discussed at Long Length of Stay Meetings, dates discussed:    Comments:  10/19/14 MMcGibboney, RN, BSN Chart reviewed.  Will continue to follow pt for Medication assistant.  [facility], did not answer the telephone today. Unable to schedule an appointment. Pt will need to walk in for appointment.

## 2014-10-19 NOTE — Progress Notes (Signed)
[facility] PROGRESS NOTE  Assessment/Plan: Acute encephalopathy/ Involuntary movements - Encephalopathy involuntarily movements now resolved. - Patient agreed to have LP performed, continue vancomycin and Rocephin and acyclovir. - Leukocytosis is slowly improving she has remained afebrile. - She denies any suicidal ideations or thoughts. She relates she does not remember taking extra medications that are prescribed to her.  Major depressive disorder, single episode, severe with psychotic features - Recent home medications.  Cocaine abuse - Counseling.    Code Status: full Family Communication: none  Disposition Plan: inpatient   Consultants:  none  Procedures:  LP  Antibiotics:  Vanc, rocephin and acyclovir  HPI/Subjective: She is she is hungry she will like to have a dye before performing LP.  Objective: Filed Vitals:   10/18/14 1645 10/18/14 1853 10/18/14 2227 10/19/14 0659  BP: 99/52 120/67 124/66 114/79  Pulse: 91 95 88 108  Temp:  98.2 F (36.8 C) 98 F (36.7 C) 98.2 F (36.8 C)  TempSrc:  Oral Axillary Oral  Resp: 18 20 20 20   Weight:  72.3 kg (159 lb 6.3 oz)    SpO2: 96% 99% 97% 98%    Intake/Output Summary (Last 24 hours) at 10/19/14 0921 Last data filed at 10/19/14 0245  Gross per 24 hour  Intake   1000 ml  Output      2 ml  Net    998 ml   Filed Weights   10/18/14 1853  Weight: 72.3 kg (159 lb 6.3 oz)    Exam:  General: Alert, awake, oriented x3, in no acute distress.  HEENT: No bruits, no goiter.  Heart: Regular rate and rhythm. Lungs: Good air movement, clear  Abdomen: Soft, nontender, nondistended, positive bowel sounds.  Neuro: Grossly intact, nonfocal.   Data Reviewed: Basic Metabolic Panel:  Recent Labs Lab 10/18/14 1353 10/18/14 1959 10/19/14 0715  NA 137  --  135  K 3.5  --  3.5  CL 99  --  100  CO2 22  --  25  GLUCOSE 62*  --  97  BUN 20  --  10  CREATININE 1.04 0.72 0.65  CALCIUM 8.8  --  8.7    Liver Function Tests:  Recent Labs Lab 10/18/14 1353  AST 85*  ALT 41*  ALKPHOS 80  BILITOT 1.5*  PROT 8.5*  ALBUMIN 5.1   No results for input(s): LIPASE, AMYLASE in the last 168 hours. No results for input(s): AMMONIA in the last 168 hours. CBC:  Recent Labs Lab 10/18/14 1353 10/18/14 1959 10/19/14 0715  WBC 23.6* 14.1* 12.2*  NEUTROABS 20.9*  --   --   HGB 11.6* 10.7* 10.8*  HCT 34.2* 31.8* 32.0*  MCV 87.9 88.6 89.4  PLT 331 220 266   Cardiac Enzymes:  Recent Labs Lab 10/18/14 1959  CKTOTAL 2606*   BNP (last 3 results) No results for input(s): BNP in the last 8760 hours.  ProBNP (last 3 results) No results for input(s): PROBNP in the last 8760 hours.  CBG:  Recent Labs Lab 10/18/14 1643  GLUCAP 110*    No results found for this or any previous visit (from the past 240 hour(s)).   Studies: Ct Head Wo Contrast  10/18/2014   CLINICAL DATA:  Altered mental status. Agitated. Involuntary movements. Alert and oriented.  EXAM: CT HEAD WITHOUT CONTRAST  TECHNIQUE: Contiguous axial images were obtained from the base of the skull through the vertex without intravenous contrast.  COMPARISON:  None.  FINDINGS: There is no evidence of  mass effect, midline shift or extra-axial fluid collections. There is no evidence of a space-occupying lesion or intracranial hemorrhage. There is no evidence of a cortical-based area of acute infarction.  The ventricles and sulci are appropriate for the patient's age. The basal cisterns are patent.  Visualized portions of the orbits are unremarkable. The visualized portions of the paranasal sinuses and mastoid air cells are unremarkable.  The osseous structures are unremarkable.  IMPRESSION: Normal CT of the brain without intravenous contrast.   Electronically Signed   By: Elige Ko   On: 10/18/2014 15:12    Scheduled Meds: . acyclovir  10 mg/kg (Ideal) Intravenous 3 times per day  . benztropine  1 mg Oral TID  . cefTRIAXone  (ROCEPHIN)  IV  2 g Intravenous Q12H  . heparin  5,000 Units Subcutaneous 3 times per day  . lamoTRIgine  50 mg Oral Daily  . LORazepam  2 mg Intravenous Once  . prenatal vitamin w/FE, FA  2 tablet Oral Q1200  . risperiDONE  2 mg Oral Daily  . sertraline  50 mg Oral Daily  . sodium chloride  1,000 mL Intravenous Once  . sodium chloride  3 mL Intravenous Q12H  . vancomycin  750 mg Intravenous Q8H   Continuous Infusions: . dextrose 5 % and 0.45% NaCl       Marinda Elk  [facility] Pager [phone]. If 7PM-7AM, please contact night-coverage at [email or web address], password [facility] 10/19/2014, 9:21 AM  LOS: 1 day

## 2014-10-22 LAB — URINE CULTURE

## 2014-10-23 ENCOUNTER — Inpatient Hospital Stay: Payer: Self-pay

## 2014-10-23 LAB — CSF CULTURE W GRAM STAIN

## 2014-10-23 LAB — CSF CULTURE: CULTURE: NO GROWTH

## 2014-10-24 LAB — CULTURE, BLOOD (ROUTINE X 2)
CULTURE: NO GROWTH
CULTURE: NO GROWTH

## 2015-02-07 ENCOUNTER — Encounter (HOSPITAL_COMMUNITY): Payer: Self-pay

## 2015-02-07 ENCOUNTER — Encounter (HOSPITAL_COMMUNITY): Payer: Self-pay | Admitting: *Deleted
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
## 2015-08-30 ENCOUNTER — Encounter (HOSPITAL_COMMUNITY): Payer: Self-pay | Admitting: Emergency Medicine

## 2015-08-30 ENCOUNTER — Emergency Department (HOSPITAL_COMMUNITY)
Admission: EM | Admit: 2015-08-30 | Discharge: 2015-08-31 | Disposition: A | Discharge status: Other Acute Inpatient Hospital | Payer: Federal, State, Local not specified - Other | Attending: Emergency Medicine | Admitting: Emergency Medicine

## 2015-08-30 DIAGNOSIS — Z3202 Encounter for pregnancy test, result negative: Secondary | ICD-10-CM | POA: Insufficient documentation

## 2015-08-30 DIAGNOSIS — F1120 Opioid dependence, uncomplicated: Secondary | ICD-10-CM | POA: Insufficient documentation

## 2015-08-30 DIAGNOSIS — F10120 Alcohol abuse with intoxication, uncomplicated: Secondary | ICD-10-CM

## 2015-08-30 DIAGNOSIS — F1410 Cocaine abuse, uncomplicated: Secondary | ICD-10-CM | POA: Insufficient documentation

## 2015-08-30 DIAGNOSIS — F1210 Cannabis abuse, uncomplicated: Secondary | ICD-10-CM | POA: Insufficient documentation

## 2015-08-30 DIAGNOSIS — Z79899 Other long term (current) drug therapy: Secondary | ICD-10-CM | POA: Insufficient documentation

## 2015-08-30 DIAGNOSIS — F17210 Nicotine dependence, cigarettes, uncomplicated: Secondary | ICD-10-CM | POA: Insufficient documentation

## 2015-08-30 DIAGNOSIS — F10129 Alcohol abuse with intoxication, unspecified: Secondary | ICD-10-CM | POA: Insufficient documentation

## 2015-08-30 DIAGNOSIS — F419 Anxiety disorder, unspecified: Secondary | ICD-10-CM | POA: Insufficient documentation

## 2015-08-30 DIAGNOSIS — F323 Major depressive disorder, single episode, severe with psychotic features: Secondary | ICD-10-CM | POA: Insufficient documentation

## 2015-08-30 LAB — RAPID URINE DRUG SCREEN, HOSP PERFORMED
AMPHETAMINES: NOT DETECTED
Barbiturates: NOT DETECTED
Benzodiazepines: NOT DETECTED
Cocaine: POSITIVE — AB
OPIATES: POSITIVE — AB
Tetrahydrocannabinol: POSITIVE — AB

## 2015-08-30 LAB — BASIC METABOLIC PANEL
ANION GAP: 5 (ref 5–15)
BUN: 10 mg/dL (ref 6–20)
CO2: 28 mmol/L (ref 22–32)
CREATININE: 0.76 mg/dL (ref 0.44–1.00)
Calcium: 8.6 mg/dL — ABNORMAL LOW (ref 8.9–10.3)
Chloride: 111 mmol/L (ref 101–111)
GFR calc non Af Amer: >60 mL/min (ref >60)
Glucose, Bld: 144 mg/dL — ABNORMAL HIGH (ref 65–99)
Potassium: 3.4 mmol/L — ABNORMAL LOW (ref 3.5–5.1)
SODIUM: 144 mmol/L (ref 135–145)

## 2015-08-30 LAB — I-STAT CHEM 8, ED
BUN: 7 mg/dL (ref 6–20)
Calcium, Ion: 1.15 mmol/L (ref 1.12–1.23)
Chloride: 102 mmol/L (ref 101–111)
Creatinine, Ser: 0.9 mg/dL (ref 0.44–1.00)
Glucose, Bld: 95 mg/dL (ref 65–99)
HCT: 43 % (ref 36.0–46.0)
Hemoglobin: 14.6 g/dL (ref 12.0–15.0)
Potassium: 3 mmol/L — ABNORMAL LOW (ref 3.5–5.1)
Sodium: 145 mmol/L (ref 135–145)
TCO2: 30 mmol/L (ref 0–100)

## 2015-08-30 LAB — CBC WITH DIFFERENTIAL/PLATELET
BASOS ABS: 0 10*3/uL (ref 0.0–0.1)
BASOS PCT: 0 %
EOS PCT: 2 %
Eosinophils Absolute: 0.1 10*3/uL (ref 0.0–0.7)
HCT: 39.2 % (ref 36.0–46.0)
Hemoglobin: 12.6 g/dL (ref 12.0–15.0)
Lymphocytes Relative: 27 %
Lymphs Abs: 1.3 10*3/uL (ref 0.7–4.0)
MCH: 28 pg (ref 26.0–34.0)
MCHC: 32.1 g/dL (ref 30.0–36.0)
MCV: 87.1 fL (ref 78.0–100.0)
MONO ABS: 0.4 10*3/uL (ref 0.1–1.0)
Monocytes Relative: 9 %
Neutro Abs: 3.1 10*3/uL (ref 1.7–7.7)
Neutrophils Relative %: 62 %
PLATELETS: 263 10*3/uL (ref 150–400)
RBC: 4.5 MIL/uL (ref 3.87–5.11)
RDW: 14.4 % (ref 11.5–15.5)
WBC: 5 10*3/uL (ref 4.0–10.5)

## 2015-08-30 LAB — URINE MICROSCOPIC-ADD ON

## 2015-08-30 LAB — URINALYSIS, ROUTINE W REFLEX MICROSCOPIC
GLUCOSE, UA: NEGATIVE mg/dL
HGB URINE DIPSTICK: NEGATIVE
Ketones, ur: NEGATIVE mg/dL
Nitrite: NEGATIVE
PH: 7 (ref 5.0–8.0)
Protein, ur: NEGATIVE mg/dL
Specific Gravity, Urine: 1.022 (ref 1.005–1.030)

## 2015-08-30 LAB — POC URINE PREG, ED: Preg Test, Ur: NEGATIVE

## 2015-08-30 LAB — ETHANOL: Alcohol, Ethyl (B): <5 mg/dL (ref <5)

## 2015-08-30 LAB — ACETAMINOPHEN LEVEL

## 2015-08-30 LAB — SALICYLATE LEVEL: Salicylate Lvl: <4 mg/dL (ref 2.8–30.0)

## 2015-08-30 MED ORDER — ONDANSETRON HCL 4 MG/2ML IJ SOLN
4.0000 mg | Freq: Once | INTRAMUSCULAR | Status: AC | PRN
  Administered 2015-08-30: 4 mg via INTRAVENOUS
  Filled 2015-08-30: qty 2

## 2015-08-30 MED ORDER — SODIUM CHLORIDE 0.9 % IV BOLUS (SEPSIS)
1000.0000 mL | Freq: Once | INTRAVENOUS | Status: AC
  Administered 2015-08-30: 1000 mL via INTRAVENOUS

## 2015-08-30 MED ORDER — IBUPROFEN 200 MG PO TABS: 600.0000 mg | ORAL_TABLET | Freq: Three times a day (TID) | ORAL | Status: DC | PRN

## 2015-08-30 MED ORDER — KETOROLAC TROMETHAMINE 30 MG/ML IJ SOLN
30.0000 mg | Freq: Once | INTRAMUSCULAR | Status: AC
  Administered 2015-08-30: 30 mg via INTRAVENOUS
  Filled 2015-08-30: qty 1

## 2015-08-30 MED ORDER — PROMETHAZINE HCL 25 MG PO TABS: 25.0000 mg | ORAL_TABLET | Freq: Three times a day (TID) | ORAL | Status: DC | PRN

## 2015-08-30 MED ORDER — POTASSIUM CHLORIDE CRYS ER 20 MEQ PO TBCR: 40.0000 meq | EXTENDED_RELEASE_TABLET | Freq: Once | ORAL | Status: DC

## 2015-08-30 MED ORDER — POTASSIUM CHLORIDE CRYS ER 20 MEQ PO TBCR
40.0000 meq | EXTENDED_RELEASE_TABLET | Freq: Once | ORAL | Status: AC
  Administered 2015-08-30: 40 meq via ORAL
  Filled 2015-08-30: qty 2

## 2015-08-30 MED ORDER — LORAZEPAM 1 MG PO TABS: 1.0000 mg | ORAL_TABLET | Freq: Three times a day (TID) | ORAL | Status: DC | PRN

## 2015-08-30 NOTE — ED Provider Notes (Signed)
CSN: [id number]     Arrival date & time 08/30/15  1256 History   First MD Initiated Contact with Patient 08/30/15 1259     Chief Complaint  Patient presents with  . Nausea  . Emesis     (Consider location/radiation/quality/duration/timing/severity/associated sxs/prior Treatment) HPI Patient presents to the emergency department with nausea, vomiting, from drinking a pint of gin and now has nausea and vomiting.  The patient states that she did not do any other alcohol or drugs last night.  She states that she does not have any blood in her vomitus.  She denies chest pain, shortness breath, weakness, dizziness, headache, blurred vision, back pain, dysuria, incontinence, or syncope.  The patient states that she did not take any medications prior to arrival Past Medical History  Diagnosis Date  . No pertinent past medical history   . Polysubstance abuse   . Mental disorder   . Depression   . Anxiety    Past Surgical History  Procedure Laterality Date  . No past surgeries     Family History  Problem Relation Age of Onset  . Diabetes Maternal Grandmother   . Heart disease Neg Hx   . Hypertension Neg Hx   . Stroke Neg Hx    Social History  Substance Use Topics  . Smoking status: Current Every Day Smoker -- 0.50 packs/day for 10 years    Types: Cigarettes  . Smokeless tobacco: None  . Alcohol Use: No   OB History    Gravida Para Term Preterm AB TAB SAB Ectopic Multiple Living   0 2 0 2 0  4     Review of Systems  All other systems negative except as documented in the HPI. All pertinent positives and negatives as reviewed in the HPI.  Allergies  Acetaminophen  Home Medications   Prior to Admission medications   Medication Sig Start Date End Date Taking? Authorizing Provider  albuterol (PROVENTIL HFA;VENTOLIN HFA) 108 (90 BASE) MCG/ACT inhaler Inhale 1-2 puffs into the lungs every 6 (six) hours as needed for wheezing. 03/25/13  Yes April Palumbo, MD  benztropine  (COGENTIN) 1 MG tablet Take 1 mg by mouth 3 (three) times daily.   Yes Historical Provider, MD  lamoTRIgine (LAMICTAL) 25 MG tablet Take 50 mg by mouth daily.   Yes Historical Provider, MD  naproxen (NAPROSYN) 500 MG tablet Take 1 tablet (500 mg total) by mouth 2 (two) times daily. 06/24/14  Yes Tatyana Kirichenko, PA-C  prenatal vitamin w/FE, FA (PRENATAL 1 + 1) 27-1 MG TABS tablet Take 2 tablets by mouth daily at 12 noon.   Yes Historical Provider, MD  risperiDONE (RISPERDAL) 2 MG tablet Take 2 mg by mouth daily.   Yes Historical Provider, MD  sertraline (ZOLOFT) 50 MG tablet Take 50 mg by mouth daily.   Yes Historical Provider, MD   BP 129/82 mmHg  Pulse 66  Temp(Src) 98.7 F (37.1 C) (Oral)  Resp 18  SpO2 100% Physical Exam  Constitutional: She is oriented to person, place, and time. She appears well-developed and well-nourished. No distress.  HENT:  Head: Normocephalic and atraumatic.  Mouth/Throat: Oropharynx is clear and moist.  Eyes: Pupils are equal, round, and reactive to light.  Neck: Normal range of motion. Neck supple.  Cardiovascular: Normal rate, regular rhythm and normal heart sounds.  Exam reveals no gallop and no friction rub.   No murmur heard. Pulmonary/Chest: Effort normal and breath sounds normal. No respiratory distress. She has no wheezes.  Abdominal: Soft. Bowel sounds are normal. She exhibits no distension. There is no tenderness.  Neurological: She is alert and oriented to person, place, and time. She exhibits normal muscle tone. Coordination normal.  Skin: Skin is warm and dry. No rash noted. No erythema.  Psychiatric: She has a normal mood and affect. Her behavior is normal.  Nursing note and vitals reviewed.   ED Course  Procedures (including critical care time) Labs Review Labs Reviewed  I-STAT CHEM 8, ED - Abnormal; Notable for the following:    Potassium 3.0 (*)    All other components within normal limits    Imaging Review No results found. I  have personally reviewed and evaluated these images and lab results as part of my medical decision-making.   EKG Interpretation None      MDM   Final diagnoses:  None   Patient has tolerated oral fluids.  She most likely has gastritis from alcohol consumption.  Told to return here as needed.  She is also most likely hung over    Charlestine Night, PA-C 08/30/15 [address], PA-C 08/30/15 1636  Loren Racer, MD 09/04/15 1210

## 2015-08-30 NOTE — Progress Notes (Addendum)
Pt noted with no pcp for medicaid family planning coverage Pt informed Cm she had no pcp Pt noted to be lying in bed with her right arm hanging out of covers Pt speaking in low tone and not opening her eyes when Cm called her name "I can hear everything you are saying" When Cm asked if she was okay and could understand her Cm reviewed resources to include a list of [facility], a list of uninsured [place] resources and information pack on what medicaid family planning covers and does not cover CM placed these items in a pt belonging bag and placed on pt clothes on bedside chair Cm attempted to get pt to complete teach back method Pt responded that CM asked her about "emergency contacts" Cm informed her Cm did not inquire about emergency contacts but pcp and Cm re reviewed resources and told pt CM was leaving written information in a pt belonging bag Pt asked for "another blanket" Cm obtained pt another warm blanket and informed ED RN, CNA CM provided pt with a blanket -ED RN stated she had just given pt a blanket and pt responded when CM placed blanket on her "ok it's cold"   Entered in d/c instructions Please use the resources provided to you by ED Case manager to follow up with a family MD/pcp, medication resources These resources include information on pcps, medicines, what you medicaid covers and does not cover housing, dental resources, affordable care act information, plus other resources for [facility]

## 2015-08-30 NOTE — Progress Notes (Signed)
This Clinical research associate spoke with Trula Ore, RN acknowledging the consult and she agreed to set up the telepsych machine.      Maryelizabeth Rowan, MSW, [facility] Triage Specialist [phone]

## 2015-08-30 NOTE — Discharge Instructions (Signed)
Return here as needed.  Slowly increase your fluid intake, rest as much as possible °

## 2015-08-30 NOTE — ED Notes (Signed)
Patient presents from home via EMS for N/V, ETOH. Reports "drank too much and now she doesn't feel good".   Last VS: 130/80, 80hr, 16resp, 97%

## 2015-08-30 NOTE — ED Notes (Signed)
Pt reporting suicidal thoughts and asking to speak to a [facility]. Provider notified.

## 2015-08-30 NOTE — ED Notes (Signed)
Pt aware urine sample is needed 
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
## 2015-08-30 NOTE — ED Notes (Signed)
Pt is sleeping with equal chest rise and fall noted.  She has eaten a sandwich and had several drinks; we have informed her that a urine specimen is required.

## 2015-08-30 NOTE — ED Notes (Signed)
Psych hold for reevaluation tomorrow per Olivia Mackie with TTS.

## 2015-08-30 NOTE — ED Notes (Signed)
Bed: WA04 Expected date:  Expected time:  Means of arrival:  Comments: Ems-nv, joint aches

## 2015-08-30 NOTE — ED Provider Notes (Signed)
Gabrielle Baker is a 30 year old female who was seen in ED earlier today for nausea and vomiting. She was evaluated, treated and discharged. While her nurse was discharging her, she indicated 2 weeks of SI with a plan.   S: [name] reports a history of depression and anxiety. She states that she broke up with a boyfriend approximately 2 weeks ago and has had worsening depression since. She reports thoughts of suicide during this time. She states she has had a history of cutting "and I don't want to go back to that place". She states that she feels unsafe going home because she may cut her wrists. She also notes thoughts of wanting to harm her ex-boyfriend but does not elaborate. She denies AVH. She states that her nausea and vomiting has resolved. She is eating a [place] sandwich and drinking water. She has no physical complaints at this time.   O: Constitutional: She is oriented to person, place, and time. She appears well-developed and well-nourished. No distress.  HENT:  Head: Normocephalic and atraumatic.  Eyes: No discharge or conjunctival injection  Neck: Normal range of motion. Neck supple.  Cardiovascular: Normal rate, regular rhythm and normal heart sounds.  No murmur heard. Pulmonary/Chest: Effort normal and breath sounds normal. No respiratory distress. Abdominal: Soft, non-tender. No guarding or rebound.  Neurological: She is alert and oriented to person, place, and time. She exhibits normal muscle tone. Coordination normal.  Skin: Skin is warm and dry.  Psychiatric: Mood is incongruent. Pt laughing and smiling while discussing suicidal ideation. States history of cutting and is concerned she may slit her wrists if discharged. Discusses a recent life stressor (breakup with boyfriend) which triggered her SI. Also endorses passive thoughts of wanting "to hurt" her ex-boyfriend. Denies AVH.  Nursing note and vitals reviewed.  Recent Results (from the past 2160 hour(s))  I-stat Chem 8,  ED     Status: Abnormal   Collection Time: 08/30/15  2:01 PM  Result Value Ref Range   Sodium 145 135 - 145 mmol/L   Potassium 3.0 (L) 3.5 - 5.1 mmol/L   Chloride 102 101 - 111 mmol/L   BUN 7 6 - 20 mg/dL   Creatinine, Ser 0.90 0.44 - 1.00 mg/dL   Glucose, Bld 95 65 - 99 mg/dL   Calcium, Ion 1.15 1.12 - 1.23 mmol/L   TCO2 30 0 - 100 mmol/L   Hemoglobin 14.6 12.0 - 15.0 g/dL   HCT 43.0 36.0 - 46.0 %  CBC with Differential     Status: None   Collection Time: 08/30/15  5:43 PM  Result Value Ref Range   WBC 5.0 4.0 - 10.5 K/uL   RBC 4.50 3.87 - 5.11 MIL/uL   Hemoglobin 12.6 12.0 - 15.0 g/dL   HCT 39.2 36.0 - 46.0 %   MCV 87.1 78.0 - 100.0 fL   MCH 28.0 26.0 - 34.0 pg   MCHC 32.1 30.0 - 36.0 g/dL   RDW 14.4 11.5 - 15.5 %   Platelets 263 150 - 400 K/uL   Neutrophils Relative % 62 %   Neutro Abs 3.1 1.7 - 7.7 K/uL   Lymphocytes Relative 27 %   Lymphs Abs 1.3 0.7 - 4.0 K/uL   Monocytes Relative 9 %   Monocytes Absolute 0.4 0.1 - 1.0 K/uL   Eosinophils Relative 2 %   Eosinophils Absolute 0.1 0.0 - 0.7 K/uL   Basophils Relative 0 %   Basophils Absolute 0.0 0.0 - 0.1 K/uL  Basic  metabolic panel     Status: Abnormal   Collection Time: 08/30/15  5:43 PM  Result Value Ref Range   Sodium 144 135 - 145 mmol/L   Potassium 3.4 (L) 3.5 - 5.1 mmol/L   Chloride 111 101 - 111 mmol/L   CO2 28 22 - 32 mmol/L   Glucose, Bld 144 (H) 65 - 99 mg/dL   BUN 10 6 - 20 mg/dL   Creatinine, Ser 0.76 0.44 - 1.00 mg/dL   Calcium 8.6 (L) 8.9 - 10.3 mg/dL   GFR calc non Af Amer >60 >60 mL/min   GFR calc Af Amer >60 >60 mL/min    Comment: (NOTE) The eGFR has been calculated using the CKD EPI equation. This calculation has not been validated in all clinical situations. eGFR's persistently <60 mL/min signify possible Chronic Kidney Disease.    Anion gap 5 5 - 15  Acetaminophen level     Status: Abnormal   Collection Time: 08/30/15  5:43 PM  Result Value Ref Range   Acetaminophen (Tylenol), Serum <10  (L) 10 - 30 ug/mL    Comment:        THERAPEUTIC CONCENTRATIONS VARY SIGNIFICANTLY. A RANGE OF 10-30 ug/mL MAY BE AN EFFECTIVE CONCENTRATION FOR MANY PATIENTS. HOWEVER, SOME ARE BEST TREATED AT CONCENTRATIONS OUTSIDE THIS RANGE. ACETAMINOPHEN CONCENTRATIONS >150 ug/mL AT 4 HOURS AFTER INGESTION AND >50 ug/mL AT 12 HOURS AFTER INGESTION ARE OFTEN ASSOCIATED WITH TOXIC REACTIONS.   Salicylate level     Status: None   Collection Time: 08/30/15  5:43 PM  Result Value Ref Range   Salicylate Lvl <1.8 2.8 - 30.0 mg/dL  Ethanol     Status: None   Collection Time: 08/30/15  5:43 PM  Result Value Ref Range   Alcohol, Ethyl (B) <5 <5 mg/dL    Comment:        LOWEST DETECTABLE LIMIT FOR SERUM ALCOHOL IS 5 mg/dL FOR MEDICAL PURPOSES ONLY     A/P:  Patient presenting with suicidal ideations. Reports concern that she will slit her wrist if discharged. Denies all other complaints at this time. VSS and pt is non-toxic appearing. Benign physical exam and blood work is unremarkable. Patient has been medically cleared in the ED and is appropriate for TTS consultation and their recommendations. Pt is in NAD and stable for holding in [facility].    Lahoma Crocker Dyke Weible, PA-C 08/30/15 1832  Gareth Morgan, MD 08/31/15 [phone]

## 2015-08-30 NOTE — ED Notes (Signed)
Patient tolerating PO fluids 

## 2015-08-31 ENCOUNTER — Encounter (HOSPITAL_COMMUNITY): Payer: Self-pay | Admitting: *Deleted

## 2015-08-31 ENCOUNTER — Inpatient Hospital Stay (HOSPITAL_COMMUNITY)
Admission: AD | Admit: 2015-08-31 | Discharge: 2015-09-04 | DRG: 885 | Disposition: A | Discharge status: Home / Self Care | Payer: Federal, State, Local not specified - Other | Source: Intra-hospital | Attending: Psychiatry | Admitting: Psychiatry

## 2015-08-31 DIAGNOSIS — F1420 Cocaine dependence, uncomplicated: Secondary | ICD-10-CM | POA: Diagnosis present

## 2015-08-31 DIAGNOSIS — Z9119 Patient's noncompliance with other medical treatment and regimen: Secondary | ICD-10-CM | POA: Diagnosis not present

## 2015-08-31 DIAGNOSIS — F4320 Adjustment disorder, unspecified: Secondary | ICD-10-CM | POA: Diagnosis present

## 2015-08-31 DIAGNOSIS — F410 Panic disorder [episodic paroxysmal anxiety] without agoraphobia: Secondary | ICD-10-CM | POA: Diagnosis present

## 2015-08-31 DIAGNOSIS — F1120 Opioid dependence, uncomplicated: Secondary | ICD-10-CM | POA: Diagnosis present

## 2015-08-31 DIAGNOSIS — F1920 Other psychoactive substance dependence, uncomplicated: Secondary | ICD-10-CM | POA: Diagnosis present

## 2015-08-31 DIAGNOSIS — F332 Major depressive disorder, recurrent severe without psychotic features: Secondary | ICD-10-CM | POA: Diagnosis present

## 2015-08-31 DIAGNOSIS — R45851 Suicidal ideations: Secondary | ICD-10-CM | POA: Diagnosis present

## 2015-08-31 DIAGNOSIS — Z814 Family history of other substance abuse and dependence: Secondary | ICD-10-CM

## 2015-08-31 DIAGNOSIS — F323 Major depressive disorder, single episode, severe with psychotic features: Secondary | ICD-10-CM

## 2015-08-31 DIAGNOSIS — G4700 Insomnia, unspecified: Secondary | ICD-10-CM | POA: Diagnosis present

## 2015-08-31 DIAGNOSIS — Z833 Family history of diabetes mellitus: Secondary | ICD-10-CM | POA: Diagnosis not present

## 2015-08-31 DIAGNOSIS — F17210 Nicotine dependence, cigarettes, uncomplicated: Secondary | ICD-10-CM | POA: Diagnosis present

## 2015-08-31 DIAGNOSIS — R45850 Homicidal ideations: Secondary | ICD-10-CM

## 2015-08-31 MED ORDER — ALBUTEROL SULFATE HFA 108 (90 BASE) MCG/ACT IN AERS: 1.0000 | INHALATION_SPRAY | Freq: Four times a day (QID) | RESPIRATORY_TRACT | Status: DC | PRN

## 2015-08-31 MED ORDER — CLONIDINE HCL 0.1 MG PO TABS
0.1000 mg | ORAL_TABLET | ORAL | Status: DC
  Administered 2015-09-03 – 2015-09-04 (×2): 0.1 mg via ORAL
  Filled 2015-08-31 (×4): qty 1

## 2015-08-31 MED ORDER — LAMOTRIGINE 25 MG PO TABS
50.0000 mg | ORAL_TABLET | Freq: Every day | ORAL | Status: DC
  Administered 2015-08-31: 50 mg via ORAL
  Filled 2015-08-31: qty 2

## 2015-08-31 MED ORDER — LOPERAMIDE HCL 2 MG PO CAPS
2.0000 mg | ORAL_CAPSULE | ORAL | Status: DC | PRN
  Administered 2015-08-31: 4 mg via ORAL
  Filled 2015-08-31: qty 2

## 2015-08-31 MED ORDER — IBUPROFEN 600 MG PO TABS
600.0000 mg | ORAL_TABLET | Freq: Three times a day (TID) | ORAL | Status: DC | PRN
  Administered 2015-09-03: 600 mg via ORAL
  Filled 2015-08-31: qty 1

## 2015-08-31 MED ORDER — CLONIDINE HCL 0.1 MG PO TABS
0.1000 mg | ORAL_TABLET | Freq: Every day | ORAL | Status: DC
  Filled 2015-08-31: qty 1

## 2015-08-31 MED ORDER — SERTRALINE HCL 50 MG PO TABS
50.0000 mg | ORAL_TABLET | Freq: Every day | ORAL | Status: DC
  Administered 2015-09-01 – 2015-09-04 (×4): 50 mg via ORAL
  Filled 2015-08-31 (×6): qty 1

## 2015-08-31 MED ORDER — SERTRALINE HCL 50 MG PO TABS
50.0000 mg | ORAL_TABLET | Freq: Every day | ORAL | Status: DC
  Administered 2015-08-31: 50 mg via ORAL
  Filled 2015-08-31: qty 1

## 2015-08-31 MED ORDER — CEPHALEXIN 500 MG PO CAPS
500.0000 mg | ORAL_CAPSULE | Freq: Three times a day (TID) | ORAL | Status: DC
  Administered 2015-08-31 (×2): 500 mg via ORAL
  Filled 2015-08-31 (×2): qty 1

## 2015-08-31 MED ORDER — RISPERIDONE 2 MG PO TABS
2.0000 mg | ORAL_TABLET | Freq: Every day | ORAL | Status: DC
  Administered 2015-08-31: 2 mg via ORAL
  Filled 2015-08-31: qty 1

## 2015-08-31 MED ORDER — BENZTROPINE MESYLATE 1 MG PO TABS
1.0000 mg | ORAL_TABLET | Freq: Three times a day (TID) | ORAL | Status: DC
  Administered 2015-08-31 – 2015-09-01 (×2): 1 mg via ORAL
  Filled 2015-08-31 (×9): qty 1

## 2015-08-31 MED ORDER — ONDANSETRON 4 MG PO TBDP
4.0000 mg | ORAL_TABLET | Freq: Four times a day (QID) | ORAL | Status: DC | PRN
  Administered 2015-09-01 – 2015-09-03 (×2): 4 mg via ORAL
  Filled 2015-08-31 (×2): qty 1

## 2015-08-31 MED ORDER — HYDROXYZINE HCL 25 MG PO TABS
25.0000 mg | ORAL_TABLET | Freq: Four times a day (QID) | ORAL | Status: DC | PRN
  Administered 2015-09-03 (×2): 25 mg via ORAL
  Filled 2015-08-31 (×2): qty 1

## 2015-08-31 MED ORDER — ALUM & MAG HYDROXIDE-SIMETH 200-200-20 MG/5ML PO SUSP: 30.0000 mL | ORAL | Status: DC | PRN

## 2015-08-31 MED ORDER — CLONIDINE HCL 0.1 MG PO TABS
0.1000 mg | ORAL_TABLET | Freq: Four times a day (QID) | ORAL | Status: AC
  Administered 2015-08-31 – 2015-09-02 (×8): 0.1 mg via ORAL
  Filled 2015-08-31 (×11): qty 1

## 2015-08-31 MED ORDER — MAGNESIUM HYDROXIDE 400 MG/5ML PO SUSP: 30.0000 mL | Freq: Every day | ORAL | Status: DC | PRN

## 2015-08-31 MED ORDER — LAMOTRIGINE 25 MG PO TABS
50.0000 mg | ORAL_TABLET | Freq: Every day | ORAL | Status: DC
  Administered 2015-09-01 – 2015-09-04 (×4): 50 mg via ORAL
  Filled 2015-08-31 (×6): qty 2

## 2015-08-31 MED ORDER — CEPHALEXIN 500 MG PO CAPS
500.0000 mg | ORAL_CAPSULE | Freq: Three times a day (TID) | ORAL | Status: DC
  Administered 2015-08-31 – 2015-09-04 (×12): 500 mg via ORAL
  Filled 2015-08-31 (×2): qty 1
  Filled 2015-08-31: qty 2
  Filled 2015-08-31: qty 1
  Filled 2015-08-31: qty 2
  Filled 2015-08-31 (×14): qty 1

## 2015-08-31 MED ORDER — BENZTROPINE MESYLATE 1 MG PO TABS
1.0000 mg | ORAL_TABLET | Freq: Three times a day (TID) | ORAL | Status: DC
  Administered 2015-08-31 (×2): 1 mg via ORAL
  Filled 2015-08-31 (×2): qty 1

## 2015-08-31 MED ORDER — METHOCARBAMOL 500 MG PO TABS: 500.0000 mg | ORAL_TABLET | Freq: Three times a day (TID) | ORAL | Status: DC | PRN

## 2015-08-31 MED ORDER — RISPERIDONE 2 MG PO TABS
2.0000 mg | ORAL_TABLET | Freq: Every day | ORAL | Status: DC
  Administered 2015-09-01: 2 mg via ORAL
  Filled 2015-08-31 (×3): qty 1

## 2015-08-31 MED ORDER — NICOTINE 21 MG/24HR TD PT24
21.0000 mg | MEDICATED_PATCH | Freq: Every day | TRANSDERMAL | Status: DC
  Administered 2015-08-31 – 2015-09-01 (×2): 21 mg via TRANSDERMAL
  Filled 2015-08-31 (×8): qty 1

## 2015-08-31 NOTE — ED Notes (Signed)
Awake. Verbally responsive. A/O x4. Resp even and unlabored. No audible adventitious breath sounds noted. ABC's intact. Sitter at bedside.

## 2015-08-31 NOTE — Progress Notes (Signed)
Gabrielle Baker is a 30 yo caucasian female who comes to [facility] today from the ED at [facility] after EMS took her  to the ED ( due to binging on alcohol, cocaine and crack). She says the thing that pushed her over the edge was she found out her husband was cheating on her. She admits when she got to the ED she had active HI ( of wanting to stab him in the chest while he slept) . She says she was last a patient here 3 years ago. She says she has " no medical problems", says she smokes cigarettes and has done so for "over 5 yrs" , states the last time she used any drugs was " 2 days ago" and denies active SI at this time. She reports she recently moved here from [place], [place] ( where she used to live) , is taking keflex for a " bladder infection" and says " I just want to go to sleep". Admission is completed and pt is escorted to her room,.

## 2015-08-31 NOTE — Tx Team (Addendum)
Initial Interdisciplinary Treatment Plan   PATIENT STRESSORS: Educational concerns Financial difficulties Health problems Legal issue Loss of relationship with boyfriend   PATIENT STRENGTHS: Ability for insight Active sense of humor Average or above average intelligence Capable of independent living Communication skills   PROBLEM LIST: Problem List/Patient Goals Date to be addressed Date deferred Reason deferred Estimated date of resolution  Polysubstance Abuse 08/31/2015     Depression and Anxi8ety 08/30/2105     Suicidal Ideation 08/31/2015     " I have a bladder infection" 08/31/2015           " I broke uo with my BF" 08/31/2015     " i don't want to live like this anymore" 08/31/2015                  DISCHARGE CRITERIA:  Ability to meet basic life and health needs Adequate post-discharge living arrangements Improved stabilization in mood, thinking, and/or behavior Medical problems require only outpatient monitoring Motivation to continue treatment in a less acute level of care  PRELIMINARY DISCHARGE PLAN: Attend aftercare/continuing care group Attend PHP/IOP Attend 12-step recovery group Outpatient therapy Participate in family therapy  PATIENT/FAMIILY INVOLVEMENT: This treatment plan has been presented to and reviewed with the patient, Joel Mer[place], and/or family member, .  The patient and family have been given the opportunity to ask questions and make suggestions.  Rich Brave 08/31/2015, 5:55 PM

## 2015-08-31 NOTE — ED Notes (Signed)
Resting quietly with eye closed. Easily arousable. Verbally responsive. Resp even and unlabored. ABC's intact. No behavior problems noted. NAD noted. Sitter at bedside. Psych MD and NP at bedside.

## 2015-08-31 NOTE — ED Notes (Signed)
Resting quietly with eye closed. Easily arousable. Verbally responsive. Resp even and unlabored. ABC's intact. No behavior problems noted. NAD noted. Sitter at bedside.  

## 2015-08-31 NOTE — Consult Note (Signed)
[place] Psychiatry Consult   Reason for Consult:  depression and suicidal thoughts and plan to cut her wrist Referring Physician:  Emergency room physician Patient Identification: Gabrielle Baker MRN:  [id number] Principal Diagnosis: Major depressive disorder, single episode, severe with psychotic features [facility]) Diagnosis:   Patient Active Problem List   Diagnosis Date Noted  . Acute encephalopathy [G93.40] 10/18/2014  . Involuntary movements [R25.8] 10/18/2014  . Active labor [O80, Z37.9] 01/17/2014  . Non-stress test nonreactive [O28.9] 01/15/2014  . Major depressive disorder, single episode, severe with psychotic features ([place]) [F32.3] 02/13/2013  . Cocaine abuse [F14.10] 02/13/2013  . Opioid abuse with opioid-induced mood disorder [facility]) [F11.14] 02/13/2013    Total Time spent with patient: 30 minutes  Subjective:   Gabrielle Baker is a 30 y.o. female patient admitted with suicidal thoughts and plan to cut her wrist.  HPI:  Patient is 30 year old female who is brought into the emergency room by EMS because she has been not feeling well.  She is feeling dizzy and unable to walk while standing.  She endorse noncompliant with medications resulting in severe depression and having suicidal thoughts and plan to cut her wrist. She also endorse having homicidal thoughts towards her husband and plan to stab him when he sleeps.  Patient has multiple  Hospitalization in the past.  Her last hospitalization was 2013.   She has been seeing psychiatrist at [facility] but noncompliant with Haldol, Cogentin, Lamictal and Zoloft.  Patient has history of cutting behavior in the past. sshe is also using drugs and her UDS is positive for cocaine, opiates and marijuana. In the emergency room she was given Toradol reason unclear. She does not want to go home because she does not feel safe by herself.  Patient denies any tremors shakes or any EPS.  Past Psychiatric History: patient has previous psychiatric  hospitalization. Her last admission at behavioral [place] was in 2013.  Risk to Self: Suicidal Ideation: Yes-Currently Present Suicidal Intent: Yes-Currently Present Is patient at risk for suicide?: Yes Suicidal Plan?: Yes-Currently Present Specify Current Suicidal Plan: cut wrist Access to Means: Yes Specify Access to Suicidal Means: any sharp items What has been your use of drugs/alcohol within the last 12 months?: alcohol, heroin, cocaine How many times?: 3 Other Self Harm Risks: hx cutting Triggers for Past Attempts: Family contact, Unpredictable Intentional Self Injurious Behavior: Cutting (cutting hx last time in 2013) Comment - Self Injurious Behavior: cutting behaviors history last time 2013 Risk to Others: Homicidal Ideation: Yes-Currently Present Thoughts of Harm to Others: Yes-Currently Present Comment - Thoughts of Harm to Others: Pt reports thoughts of killing her husband Current Homicidal Intent: Yes-Currently Present Current Homicidal Plan: Yes-Currently Present Describe Current Homicidal Plan: stabb him while sleeps Access to Homicidal Means: Yes Describe Access to Homicidal Means: sharp items Identified Victim: husband History of harm to others?: No Assessment of Violence: None Noted Violent Behavior Description: none reported 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  : No Does patient have [facility] services? : No Does patient have P4CC services?: No  Past Medical History:  Past Medical History  Diagnosis Date  . No pertinent past medical history   . Polysubstance abuse   .  Mental disorder   . Depression   . Anxiety     Past Surgical History  Procedure Laterality Date  . No past surgeries     Family History:  Family History  Problem Relation Age of Onset  . Diabetes Maternal Grandmother   . Heart disease Neg Hx   . Hypertension Neg Hx   . Stroke Neg Hx    Family Psychiatric  History: see above Social History:  History  Alcohol Use No     History  Drug Use  . Yes  . Special: Marijuana, "Crack" cocaine, Cocaine    Comment: states has been a "long time"    Social History   Social History  . Marital Status: Married    Spouse Name: N/A  . Number of Children: N/A  . Years of Education: N/A   Social History Main Topics  . Smoking status: Current Every Day Smoker -- 0.50 packs/day for 10 years    Types: Cigarettes  . Smokeless tobacco: None  . Alcohol Use: No  . Drug Use: Yes    Special: Marijuana, "Crack" cocaine, Cocaine     Comment: states has been a "long time"  . Sexual Activity: Yes    Birth Control/ Protection: None   Other Topics Concern  . None   Social History Narrative   ** Merged History Encounter **       Additional Social History:    Pain Medications: see chart  Prescriptions: see chart Over the Counter: see chart History of alcohol / drug use?: Yes Longest period of sobriety (when/how long): unknown Negative Consequences of Use: Personal relationships, Work / Youth worker Withdrawal Symptoms: Sweats, Diarrhea, Agitation, Fever / Chills, Irritability, Nausea / Vomiting, Cramps Name of Substance 1: Alcohol 1 - Age of First Use: 16 1 - Amount (size/oz): pint 1 - Frequency: 1 time weekly 1 - Duration: ongoing 1 - Last Use / Amount: 1/26 Name of Substance 2: Heroin(IV) 2 - Age of First Use: 14 2 - Amount (size/oz): $100 2 - Frequency: daily 2 - Duration: ongoing 2 - Last Use / Amount: 1/26 Name of Substance 3: Cocaine(smoke) 3 - Age of First Use: 15 3 - Amount (size/oz): $200 3 - Frequency: daily 3 - Duration:  ongoing 3 - Last Use / Amount: 1/26               Allergies:   Allergies  Allergen Reactions  . Acetaminophen Diarrhea    Patient states she tolerates percocet    Labs:  Results for orders placed or performed during the hospital encounter of 08/30/15 (from the past 48 hour(s))  I-stat Chem 8, ED     Status: Abnormal   Collection Time: 08/30/15  2:01 PM  Result Value Ref Range   Sodium 145 135 - 145 mmol/L   Potassium 3.0 (L) 3.5 - 5.1 mmol/L   Chloride 102 101 - 111 mmol/L   BUN 7 6 - 20 mg/dL   Creatinine, Ser 0.90 0.44 - 1.00 mg/dL   Glucose, Bld 95 65 - 99 mg/dL   Calcium, Ion 1.15 1.12 - 1.23 mmol/L   TCO2 30 0 - 100 mmol/L   Hemoglobin 14.6 12.0 - 15.0 g/dL   HCT 43.0 36.0 - 46.0 %  CBC with Differential     Status: None   Collection Time: 08/30/15  5:43 PM  Result Value Ref Range   WBC 5.0 4.0 - 10.5 K/uL   RBC 4.50 3.87 - 5.11 MIL/uL   Hemoglobin 12.6 12.0 -  15.0 g/dL   HCT 39.2 36.0 - 46.0 %   MCV 87.1 78.0 - 100.0 fL   MCH 28.0 26.0 - 34.0 pg   MCHC 32.1 30.0 - 36.0 g/dL   RDW 14.4 11.5 - 15.5 %   Platelets 263 150 - 400 K/uL   Neutrophils Relative % 62 %   Neutro Abs 3.1 1.7 - 7.7 K/uL   Lymphocytes Relative 27 %   Lymphs Abs 1.3 0.7 - 4.0 K/uL   Monocytes Relative 9 %   Monocytes Absolute 0.4 0.1 - 1.0 K/uL   Eosinophils Relative 2 %   Eosinophils Absolute 0.1 0.0 - 0.7 K/uL   Basophils Relative 0 %   Basophils Absolute 0.0 0.0 - 0.1 K/uL  Basic metabolic panel     Status: Abnormal   Collection Time: 08/30/15  5:43 PM  Result Value Ref Range   Sodium 144 135 - 145 mmol/L   Potassium 3.4 (L) 3.5 - 5.1 mmol/L   Chloride 111 101 - 111 mmol/L   CO2 28 22 - 32 mmol/L   Glucose, Bld 144 (H) 65 - 99 mg/dL   BUN 10 6 - 20 mg/dL   Creatinine, Ser 0.76 0.44 - 1.00 mg/dL   Calcium 8.6 (L) 8.9 - 10.3 mg/dL   GFR calc non Af Amer >60 >60 mL/min   GFR calc Af Amer >60 >60 mL/min    Comment: (NOTE) The eGFR has been calculated using the CKD EPI  equation. This calculation has not been validated in all clinical situations. eGFR's persistently <60 mL/min signify possible Chronic Kidney Disease.    Anion gap 5 5 - 15  Acetaminophen level     Status: Abnormal   Collection Time: 08/30/15  5:43 PM  Result Value Ref Range   Acetaminophen (Tylenol), Serum <10 (L) 10 - 30 ug/mL    Comment:        THERAPEUTIC CONCENTRATIONS VARY SIGNIFICANTLY. A RANGE OF 10-30 ug/mL MAY BE AN EFFECTIVE CONCENTRATION FOR MANY PATIENTS. HOWEVER, SOME ARE BEST TREATED AT CONCENTRATIONS OUTSIDE THIS RANGE. ACETAMINOPHEN CONCENTRATIONS >150 ug/mL AT 4 HOURS AFTER INGESTION AND >50 ug/mL AT 12 HOURS AFTER INGESTION ARE OFTEN ASSOCIATED WITH TOXIC REACTIONS.   Salicylate level     Status: None   Collection Time: 08/30/15  5:43 PM  Result Value Ref Range   Salicylate Lvl <5.3 2.8 - 30.0 mg/dL  Ethanol     Status: None   Collection Time: 08/30/15  5:43 PM  Result Value Ref Range   Alcohol, Ethyl (B) <5 <5 mg/dL    Comment:        LOWEST DETECTABLE LIMIT FOR SERUM ALCOHOL IS 5 mg/dL FOR MEDICAL PURPOSES ONLY   Urinalysis, Routine w reflex microscopic (not at [facility])     Status: Abnormal   Collection Time: 08/30/15  8:01 PM  Result Value Ref Range   Color, Urine YELLOW YELLOW   APPearance CLOUDY (A) CLEAR   Specific Gravity, Urine 1.022 1.005 - 1.030   pH 7.0 5.0 - 8.0   Glucose, UA NEGATIVE NEGATIVE mg/dL   Hgb urine dipstick NEGATIVE NEGATIVE   Bilirubin Urine SMALL (A) NEGATIVE   Ketones, ur NEGATIVE NEGATIVE mg/dL   Protein, ur NEGATIVE NEGATIVE mg/dL   Nitrite NEGATIVE NEGATIVE   Leukocytes, UA MODERATE (A) NEGATIVE  Urine rapid drug screen (hosp performed)     Status: Abnormal   Collection Time: 08/30/15  8:01 PM  Result Value Ref Range   Opiates POSITIVE (A) NONE DETECTED   Cocaine  POSITIVE (A) NONE DETECTED   Benzodiazepines NONE DETECTED NONE DETECTED   Amphetamines NONE DETECTED NONE DETECTED   Tetrahydrocannabinol POSITIVE (A)  NONE DETECTED   Barbiturates NONE DETECTED NONE DETECTED    Comment:        DRUG SCREEN FOR MEDICAL PURPOSES ONLY.  IF CONFIRMATION IS NEEDED FOR ANY PURPOSE, NOTIFY LAB WITHIN 5 DAYS.        LOWEST DETECTABLE LIMITS FOR URINE DRUG SCREEN Drug Class       Cutoff (ng/mL) Amphetamine      1000 Barbiturate      200 Benzodiazepine   827 Tricyclics       078 Opiates          300 Cocaine          300 THC              50   Urine microscopic-add on     Status: Abnormal   Collection Time: 08/30/15  8:01 PM  Result Value Ref Range   Squamous Epithelial / LPF 6-30 (A) NONE SEEN   WBC, UA TOO NUMEROUS TO COUNT 0 - 5 WBC/hpf   RBC / HPF 0-5 0 - 5 RBC/hpf   Bacteria, UA MANY (A) NONE SEEN   Crystals CA OXALATE CRYSTALS (A) NEGATIVE   Urine-Other AMORPHOUS URATES/PHOSPHATES   POC Urine Pregnancy, ED (do NOT order at [facility])     Status: None   Collection Time: 08/30/15  8:06 PM  Result Value Ref Range   Preg Test, Ur NEGATIVE NEGATIVE    Comment:        THE SENSITIVITY OF THIS METHODOLOGY IS >24 mIU/mL     Current Facility-Administered Medications  Medication Dose Route Frequency Provider Last Rate Last Dose  . albuterol (PROVENTIL HFA;VENTOLIN HFA) 108 (90 Base) MCG/ACT inhaler 1-2 puff  1-2 puff Inhalation Q6H PRN Deno Etienne, DO      . benztropine (COGENTIN) tablet 1 mg  1 mg Oral TID Deno Etienne, DO   1 mg at 08/31/15 0926  . cephALEXin (KEFLEX) capsule 500 mg  500 mg Oral 3 times per day Patrecia Pour, NP   500 mg at 08/31/15 0926  . ibuprofen (ADVIL,MOTRIN) tablet 600 mg  600 mg Oral Q8H PRN Stevi Barrett, PA-C      . lamoTRIgine (LAMICTAL) tablet 50 mg  50 mg Oral Daily Deno Etienne, DO   50 mg at 08/31/15 0926  . risperiDONE (RISPERDAL) tablet 2 mg  2 mg Oral Daily Deno Etienne, DO   2 mg at 08/31/15 0926  . sertraline (ZOLOFT) tablet 50 mg  50 mg Oral Daily Deno Etienne, DO   50 mg at 08/31/15 6754   Current Outpatient Prescriptions  Medication Sig Dispense Refill  . albuterol (PROVENTIL  HFA;VENTOLIN HFA) 108 (90 BASE) MCG/ACT inhaler Inhale 1-2 puffs into the lungs every 6 (six) hours as needed for wheezing. 1 Inhaler 0  . benztropine (COGENTIN) 1 MG tablet Take 1 mg by mouth 3 (three) times daily.    Marland Kitchen lamoTRIgine (LAMICTAL) 25 MG tablet Take 50 mg by mouth daily.    . naproxen (NAPROSYN) 500 MG tablet Take 1 tablet (500 mg total) by mouth 2 (two) times daily. 30 tablet 0  . prenatal vitamin w/FE, FA (PRENATAL 1 + 1) 27-1 MG TABS tablet Take 2 tablets by mouth daily at 12 noon.    . risperiDONE (RISPERDAL) 2 MG tablet Take 2 mg by mouth daily.    . sertraline (ZOLOFT) 50 MG  tablet Take 50 mg by mouth daily.    . promethazine (PHENERGAN) 25 MG tablet Take 1 tablet (25 mg total) by mouth every 8 (eight) hours as needed for nausea or vomiting. 15 tablet 0    Musculoskeletal: Strength & Muscle Tone: within normal limits Gait & Station: patient lying on the bed Patient leans: N/A  Psychiatric Specialty Exam: Review of Systems  Psychiatric/Behavioral: Positive for depression, suicidal ideas and substance abuse. The patient is nervous/anxious and has insomnia.     Blood pressure 154/96, pulse 82, temperature 98.9 F (37.2 C), temperature source Oral, resp. rate 18, SpO2 96 %, unknown if currently breastfeeding.There is no weight on file to calculate BMI.  General Appearance: Fairly Groomed and Guarded  Engineer, water::  Poor  Speech:  Slow  Volume:  Decreased  Mood:  Depressed, Dysphoric and Hopeless  Affect:  Constricted and Depressed  Thought Process:  Coherent  Orientation:  Full (Time, Place, and Person)  Thought Content:  Rumination  Suicidal Thoughts:  Yes.  with intent/plan  Homicidal Thoughts:  Yes.  with intent/plan  Memory:  Immediate;   Fair Recent;   Fair Remote;   Fair  Judgement:  Impaired  Insight:  Lacking  Psychomotor Activity:  Decreased  Concentration:  Fair  Recall:  [place]: Fair  Akathisia:  No  Handed:  Right   AIMS (if indicated):     Assets:  Communication Skills Desire for Improvement Housing  ADL's:  Intact  Cognition: WNL  Sleep:      Treatment Plan Summary: Plan patient requires inpatient treatment.  We will start Lamictal,  Zoloft,, Cogentin.  Patient does not want Haldol to start  Risperdal 2 mg at bedtime. We will transfer to behavioral [place] when bed is available.  Disposition: Recommend psychiatric Inpatient admission when medically cleared.  Candus Braud T. 08/31/2015 1:34 PM

## 2015-08-31 NOTE — BHH Counselor (Signed)
Patient has been accepted to [facility] by Dr. Lolly Mustache.  Patient reviewed and signed voluntary admission and consent to treat and ROI to no one. Patient denies questions or concerns at this time.  Patient will be transported via [facility]. Nurse to contact [facility] at [phone]. Call nurse report to [phone] for report and admission time. Paperwork faxed to [facility] and provided to patients nurse to transport with patient to [facility].   Davina Poke, LCSW Therapeutic Triage Specialist [facility] 08/31/2015 3:14 PM

## 2015-08-31 NOTE — ED Notes (Signed)
Awake. Verbally responsive. A/O x4. Resp even and unlabored. No audible adventitious breath sounds noted. ABC's intact. Pt eating meal without difficulty.

## 2015-08-31 NOTE — ED Notes (Signed)
Awake. Verbally responsive. A/O x4. Resp even and unlabored. No audible adventitious breath sounds noted. ABC's intact. Watching TV. No behavior problems noted. Sitter at bedside.

## 2015-08-31 NOTE — ED Notes (Signed)
Resting quietly with eye closed. Easily arousable. Verbally responsive. Resp even and unlabored. ABC's intact. No behavior problems noted. Pt denies SI/HI and audilble/visual hallucinations. NAD noted. Sitter at bedside.  

## 2015-08-31 NOTE — ED Notes (Addendum)
Called report to [place], Charity fundraiser at [facility]. Pt transferring to [facility]. Called [facility] to arrange for transportation with ETA .

## 2015-08-31 NOTE — ED Notes (Signed)
Awake. Verbally responsive. A/O x4. Resp even and unlabored. No audible adventitious breath sounds noted. ABC's intact. Pt eaten 100% of breakfast. Sitter at bedside.

## 2015-08-31 NOTE — ED Notes (Signed)
Awake. Verbally responsive. A/O x4. Resp even and unlabored. No audible adventitious breath sounds noted. ABC's intact.  

## 2015-08-31 NOTE — ED Notes (Signed)
Awake. Verbally responsive. A/O x4. Resp even and unlabored. No audible adventitious breath sounds noted. ABC's intact. Pt watching TV and given snacks. Sitter at bedside.

## 2015-08-31 NOTE — ED Notes (Signed)
[facility] arrived to transport to [facility]. Pt ambulated with steady gait.

## 2015-08-31 NOTE — Progress Notes (Signed)
Patient ID: Gabrielle Baker, female   DOB: January 11, 1986, 30 y.o.   MRN: [id number] D: Client in bed earlier this shift but up later for medications, reports diarrhea and withdrawal symptoms, "anxiety" "shakey" Client seen in the day room on telephone. A: Writer reviewed medication, administered Imodium and Clonidine (see MAR). Staff will monitor q25min for safety. R: client is safe on the unit, attended group.

## 2015-08-31 NOTE — Progress Notes (Signed)
Patient did attend the first half of the evening speaker AA meeting. Pt left group and took a shower.

## 2015-09-01 ENCOUNTER — Encounter (HOSPITAL_COMMUNITY): Payer: Self-pay | Admitting: Psychiatry

## 2015-09-01 DIAGNOSIS — F332 Major depressive disorder, recurrent severe without psychotic features: Principal | ICD-10-CM

## 2015-09-01 DIAGNOSIS — F410 Panic disorder [episodic paroxysmal anxiety] without agoraphobia: Secondary | ICD-10-CM

## 2015-09-01 DIAGNOSIS — F1120 Opioid dependence, uncomplicated: Secondary | ICD-10-CM

## 2015-09-01 DIAGNOSIS — R45851 Suicidal ideations: Secondary | ICD-10-CM

## 2015-09-01 DIAGNOSIS — F1920 Other psychoactive substance dependence, uncomplicated: Secondary | ICD-10-CM

## 2015-09-01 MED ORDER — LORAZEPAM 1 MG PO TABS
1.0000 mg | ORAL_TABLET | Freq: Four times a day (QID) | ORAL | Status: DC | PRN
  Administered 2015-09-03 (×2): 1 mg via ORAL
  Filled 2015-09-01 (×2): qty 1

## 2015-09-01 MED ORDER — QUETIAPINE FUMARATE 50 MG PO TABS
50.0000 mg | ORAL_TABLET | Freq: Three times a day (TID) | ORAL | Status: DC
Start: 2015-09-01 — End: 2015-09-04
  Administered 2015-09-01 – 2015-09-04 (×10): 50 mg via ORAL
  Filled 2015-09-01 (×15): qty 1

## 2015-09-01 MED ORDER — QUETIAPINE FUMARATE 200 MG PO TABS
200.0000 mg | ORAL_TABLET | Freq: Every day | ORAL | Status: DC
  Administered 2015-09-01 – 2015-09-03 (×3): 200 mg via ORAL
  Filled 2015-09-01 (×6): qty 1

## 2015-09-01 NOTE — Progress Notes (Signed)
Patient did not attend the evening speaker AA meeting. Pt was notified that group was beginning but remained in bed.   

## 2015-09-01 NOTE — Progress Notes (Signed)
Gabrielle Baker has been quiet and has kept to herself, staying in her room , in her bed, most all of today. She did complete her daily assessment  And on it she wrote she has had suicidal ideation today . SHe was questioned by this nurse, if she could / would stay safe and she says " yes". She rated her depression, hopelessness and anxiety " 03/10/09", resepctively . A She complained of nausea and vomiting before  Dinner and was given a prn dose of zofran ( and stated " it helped" ). Afterwards.    A She is encouraged to drink fluids, get out of the bd and stay in the dayroom and stay positive.    R Safety is in place.

## 2015-09-01 NOTE — Plan of Care (Signed)
Problem: Alteration in mood & ability to function due to Goal: STG-Patient will report withdrawal symptoms Outcome: Progressing Client reports withdrawal symptoms AEB verbalizing "I'm having diarrhea, and I'm shaking"
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
## 2015-09-01 NOTE — [facility] Suicide Risk Assessment (Signed)
[facility] Adult Inpatient Family/Significant Other Suicide Prevention Education  Suicide Prevention Education:   Patient Refusal for Family/Significant Other Suicide Prevention Education: The patient has refused to provide written consent for family/significant other to be provided Family/Significant Other Suicide Prevention Education during admission and/or prior to discharge.  Physician notified.  CSW provided suicide prevention information with patient.    The suicide prevention education provided includes the following:  Suicide risk factors  Suicide prevention and interventions  National Suicide Hotline telephone number  [facility] assessment telephone number  [facility] Emergency Assistance 911  [facility] and/or Residential Mobile Crisis Unit telephone number   Reyes Ivan, [place] 09/01/2015 9:58 AM

## 2015-09-01 NOTE — [facility] Group Notes (Signed)
[facility] Group Notes:  (Nursing/MHT/Case Management/Adjunct)  Date:  09/01/2015  Time:  1400 Type of Therapy:  Nurse Education  /  Healthy Support Systems : The group focuses  On teaching patients how to develop healthy support systems.  Participation Level:  Active  Participation Quality:  Appropriate  Affect:  Appropriate  Cognitive:  Appropriate  Insight:  Appropriate  Engagement in Group:  Engaged  Modes of Intervention:  Education  Summary of Progress/Problems:  Rich Brave 09/01/2015, 3:56 PM

## 2015-09-01 NOTE — [facility] Suicide Risk Assessment (Signed)
[facility] Admission Suicide Risk Assessment   Nursing information obtained from:    Demographic factors:    Current Mental Status:    Loss Factors:    Historical Factors:    Risk Reduction Factors:     Total Time spent with patient: 45 minutes Principal Problem: Major depressive disorder, recurrent episode, severe (HCC) Diagnosis:   Patient Active Problem List   Diagnosis Date Noted  . Polysubstance (including opioids) dependence, daily use (HCC) [F19.20] 09/01/2015  . Major depressive disorder, recurrent episode, severe (HCC) [F33.2] 08/31/2015  . Acute encephalopathy [G93.40] 10/18/2014  . Involuntary movements [R25.8] 10/18/2014  . Active labor [O80, Z37.9] 01/17/2014  . Non-stress test nonreactive [O28.9] 01/15/2014  . Cocaine abuse [F14.10] 02/13/2013  . Opioid abuse with opioid-induced mood disorder [facility]) [F11.14] 02/13/2013   Subjective Data: see admission H and P  Continued Clinical Symptoms:  Alcohol Use Disorder Identification Test Final Score (AUDIT): 22 The "Alcohol Use Disorders Identification Test", Guidelines for Use in Primary Care, Second Edition.  World Science writer [facility]). Score between 0-7:  no or low risk or alcohol related problems. Score between 8-15:  moderate risk of alcohol related problems. Score between 16-19:  high risk of alcohol related problems. Score 20 or above:  warrants further diagnostic evaluation for alcohol dependence and treatment.   CLINICAL FACTORS:   Severe Anxiety and/or Agitation Panic Attacks Depression:   Comorbid alcohol abuse/dependence Alcohol/Substance Abuse/Dependencies  Psychiatric Specialty Exam: ROS  Blood pressure 119/83, pulse 97, temperature 97.7 F (36.5 C), temperature source Oral, resp. rate 16, height  (1.651 m), weight 74.844 kg (165 lb), SpO2 99 %, unknown if currently breastfeeding.Body mass index is 27.46 kg/(m^2).   COGNITIVE FEATURES THAT CONTRIBUTE TO RISK:  Closed-mindedness, Polarized thinking  and Thought constriction (tunnel vision)    SUICIDE RISK:   Moderate:  Frequent suicidal ideation with limited intensity, and duration, some specificity in terms of plans, no associated intent, good self-control, limited dysphoria/symptomatology, some risk factors present, and identifiable protective factors, including available and accessible social support.  PLAN OF CARE: see admission H and P  I certify that inpatient services furnished can reasonably be expected to improve the patient's condition.   Rachael Fee, MD 09/01/2015, 7:03 PM
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
## 2015-09-01 NOTE — [facility] Group Notes (Signed)
[facility] Group Notes: (Clinical Social Work)   09/01/2015      Type of Therapy:  Group Therapy   Participation Level:  Did Not Attend despite MHT prompting   Renald Haithcock Grossman-Orr, LCSW 09/01/2015, 2:26 PM     

## 2015-09-02 LAB — COMPREHENSIVE METABOLIC PANEL
ALT: 42 U/L (ref 14–54)
AST: 36 U/L (ref 15–41)
Albumin: 3.6 g/dL (ref 3.5–5.0)
Alkaline Phosphatase: 60 U/L (ref 38–126)
Anion gap: 8 (ref 5–15)
BUN: 14 mg/dL (ref 6–20)
CHLORIDE: 104 mmol/L (ref 101–111)
CO2: 27 mmol/L (ref 22–32)
CREATININE: 0.97 mg/dL (ref 0.44–1.00)
Calcium: 9.1 mg/dL (ref 8.9–10.3)
Glucose, Bld: 81 mg/dL (ref 65–99)
POTASSIUM: 3.9 mmol/L (ref 3.5–5.1)
Sodium: 139 mmol/L (ref 135–145)
Total Bilirubin: 0.4 mg/dL (ref 0.3–1.2)
Total Protein: 6.9 g/dL (ref 6.5–8.1)

## 2015-09-02 NOTE — [facility] Group Notes (Signed)
[facility] LCSW Group Therapy 09/02/2015  1:15 PM   Type of Therapy: Group Therapy  Participation Level: Did Not Attend. Patient invited to participate but declined.   Wally Behan, MSW, LCSWA Clinical Social Worker [facility] [phone]   

## 2015-09-02 NOTE — [facility] Group Notes (Signed)
[facility] LCSW Aftercare Discharge Planning Group Note  09/02/2015  8:45 AM  Participation Quality: Did Not Attend. Patient invited to participate but declined.  Ares Cardozo, MSW, LCSW Clinical Social Worker [facility] [phone]   

## 2015-09-02 NOTE — Progress Notes (Signed)
Pt did not attend wrap-up group   

## 2015-09-02 NOTE — Progress Notes (Signed)
Pt has been in bed all evening.  When approached by writer, she answered questions minimally and stated that she did not want to talk right now.  She said she was still having passive suicidal thoughts, but could contract for safety.  She denies HI/AVH at this time.  Writer informed pt of the meds that were ordered for her tonight.  Pt voiced understanding and voiced no needs or concerns at this time.  Support and encouragement offered.  Discharge plans are in process.  Pt encouraged to make his needs known to staff.  Safety maintained with q15 minute checks.

## 2015-09-02 NOTE — Clinical Social Work Note (Signed)
[facility] referrals made with patient's permission.  Samuella Bruin, LCSW Clinical Social Worker [facility] [phone]

## 2015-09-02 NOTE — Progress Notes (Signed)
Patient resting in bed all day. States she does not feel well enough to come up for medications. Forwards minimal information, brief eye contact. States withdrawal symptoms include diarrhea, shakiness, tremors, chills. Refuses anti-diarrheal. Refusing to complete self inventory even with staff assist. "I just feel too sick." Medicated per orders, VS monitored. Emotional support offered. Fluids provided and encouraged. Discussed with tx team. No prn's requested or required. Denies pain. Endorsing passive SI with thoughts to slit her wrists though denies any intent while in hospital. Verbally contracts for safety should she develop urges. No HI. Patient remains safe on level III obs. [place]

## 2015-09-02 NOTE — Plan of Care (Signed)
Problem: Alteration in mood Goal: STG-Patient reports thoughts of self-harm to staff Outcome: Progressing Patient endorsing passive SI to slit her wrists however states she has no intention of acting on thoughts here. Verbally contracts for safety should she develop urges.  Problem: Alteration in mood & ability to function due to Goal: STG-Patient will comply with prescribed medication regimen (Patient will comply with prescribed medication regimen)  Outcome: Progressing Patient has been med compliant.

## 2015-09-02 NOTE — Progress Notes (Signed)
[facility] MD Progress Note  09/02/2015 5:34 PM Lexey Fletes  MRN:  [id number] Subjective:  States that looking back her thoughts about her husband cheating on her were part of her paranoia when high on drugs. States that now clear headed she cant see that happening. States "he is at home all the time"  She was able to eat today. She wonders if she has ADHD Principal Problem: Major depressive disorder, recurrent episode, severe (HCC) Diagnosis:   Patient Active Problem List   Diagnosis Date Noted  . Polysubstance (including opioids) dependence, daily use (HCC) [F19.20] 09/01/2015  . Panic attacks [F41.0] 09/01/2015  . Major depressive disorder, recurrent episode, severe (HCC) [F33.2] 08/31/2015  . Acute encephalopathy [G93.40] 10/18/2014  . Involuntary movements [R25.8] 10/18/2014  . Active labor [O80, Z37.9] 01/17/2014  . Non-stress test nonreactive [O28.9] 01/15/2014  . Cocaine abuse [F14.10] 02/13/2013  . Opioid abuse with opioid-induced mood disorder [facility]) [F11.14] 02/13/2013   Total Time spent with patient: 20 minutes  Past Psychiatric History: see admission H and P  Past Medical History:  Past Medical History  Diagnosis Date  . No pertinent past medical history   . Polysubstance abuse   . Mental disorder   . Depression   . Anxiety     Past Surgical History  Procedure Laterality Date  . No past surgeries     Family History:  Family History  Problem Relation Age of Onset  . Diabetes Maternal Grandmother   . Heart disease Neg Hx   . Hypertension Neg Hx   . Stroke Neg Hx    Family Psychiatric  History: see admission H and P Social History:  History  Alcohol Use  . Yes    Comment: 1 pint of gin per week     History  Drug Use  . Yes  . Special: Marijuana, "Crack" cocaine, Cocaine    Comment: states has been a "long time"    Social History   Social History  . Marital Status: Married    Spouse Name: N/A  . Number of Children: N/A  . Years of Education: N/A    Social History Main Topics  . Smoking status: Current Every Day Smoker -- 0.50 packs/day for 10 years    Types: Cigarettes  . Smokeless tobacco: Never Used  . Alcohol Use: Yes     Comment: 1 pint of gin per week  . Drug Use: Yes    Special: Marijuana, "Crack" cocaine, Cocaine     Comment: states has been a "long time"  . Sexual Activity: Yes    Birth Control/ Protection: None   Other Topics Concern  . None   Social History Narrative   ** Merged History Encounter **       Additional Social History:    Pain Medications: n/a History of alcohol / drug use?: Yes Longest period of sobriety (when/how long): unknown Negative Consequences of Use: Financial, Armed forces operational officer, Personal relationships, Work / School Withdrawal Symptoms: Anorexia, Irritability Name of Substance 1: alcohol 1 - Age of First Use: 30yo 1 - Amount (size/oz): pint vodka per wk 1 - Frequency: daily 1 - Duration: as much as possible 1 - Last Use / Amount: 08/29/2015 Name of Substance 2: heroin 2 - Age of First Use: 30yo 2 - Amount (size/oz): $100 2 - Frequency: when availalble 2 - Duration: as much as possible 2 - Last Use / Amount: 1.26.2017 Name of Substance 3: cocaine 3 - Age of First Use: 30yo 3 - Amount (size/oz): $200  3 - Last Use / Amount: 08/29/2015              Sleep: Fair  Appetite:  Fair  Current Medications: Current Facility-Administered Medications  Medication Dose Route Frequency Provider Last Rate Last Dose  . albuterol (PROVENTIL HFA;VENTOLIN HFA) 108 (90 Base) MCG/ACT inhaler 1-2 puff  1-2 puff Inhalation Q6H PRN Charm Rings, NP      . alum & mag hydroxide-simeth (MAALOX/MYLANTA) 200-200-20 MG/5ML suspension 30 mL  30 mL Oral Q4H PRN Charm Rings, NP      . cephALEXin (KEFLEX) capsule 500 mg  500 mg Oral 3 times per day Charm Rings, NP   500 mg at 09/02/15 1423  . cloNIDine (CATAPRES) tablet 0.1 mg  0.1 mg Oral QID Court Joy, PA-C   0.1 mg at 09/02/15 1708   Followed by  .  [START ON 09/03/2015] cloNIDine (CATAPRES) tablet 0.1 mg  0.1 mg Oral BH-qamhs Court Joy, PA-C       Followed by  . [START ON 09/06/2015] cloNIDine (CATAPRES) tablet 0.1 mg  0.1 mg Oral QAC breakfast Court Joy, PA-C      . hydrOXYzine (ATARAX/VISTARIL) tablet 25 mg  25 mg Oral Q6H PRN Court Joy, PA-C      . ibuprofen (ADVIL,MOTRIN) tablet 600 mg  600 mg Oral Q8H PRN Charm Rings, NP      . lamoTRIgine (LAMICTAL) tablet 50 mg  50 mg Oral Daily Charm Rings, NP   50 mg at 09/02/15 1610  . loperamide (IMODIUM) capsule 2-4 mg  2-4 mg Oral PRN Court Joy, PA-C   4 mg at 08/31/15 2304  . LORazepam (ATIVAN) tablet 1 mg  1 mg Oral Q6H PRN Rachael Fee, MD      . magnesium hydroxide (MILK OF MAGNESIA) suspension 30 mL  30 mL Oral Daily PRN Charm Rings, NP      . methocarbamol (ROBAXIN) tablet 500 mg  500 mg Oral Q8H PRN Court Joy, PA-C      . nicotine (NICODERM CQ - dosed in mg/24 hours) patch 21 mg  21 mg Transdermal Daily Rachael Fee, MD   21 mg at 09/01/15 1230  . ondansetron (ZOFRAN-ODT) disintegrating tablet 4 mg  4 mg Oral Q6H PRN Court Joy, PA-C   4 mg at 09/01/15 1629  . QUEtiapine (SEROQUEL) tablet 200 mg  200 mg Oral QHS Rachael Fee, MD   200 mg at 09/01/15 2112  . QUEtiapine (SEROQUEL) tablet 50 mg  50 mg Oral TID Rachael Fee, MD   50 mg at 09/02/15 1708  . sertraline (ZOLOFT) tablet 50 mg  50 mg Oral Daily Charm Rings, NP   50 mg at 09/02/15 9604    Lab Results: No results found for this or any previous visit (from the past 48 hour(s)).  Physical Findings: AIMS: Facial and Oral Movements Muscles of Facial Expression: None, normal Lips and Perioral Area: None, normal Jaw: None, normal Tongue: None, normal,Extremity Movements Upper (arms, wrists, hands, fingers): None, normal Lower (legs, knees, ankles, toes): None, normal, Trunk Movements Neck, shoulders, hips: None, normal, Overall Severity Severity of abnormal movements (highest score  from questions above): None, normal Incapacitation due to abnormal movements: None, normal Patient's awareness of abnormal movements (rate only patient's report): No Awareness, Dental Status Current problems with teeth and/or dentures?: No Does patient usually wear dentures?: No  CIWA:  CIWA-Ar Total: 3 COWS:  COWS  Total Score: 5  Musculoskeletal: Strength & Muscle Tone: within normal limits Gait & Station: normal Patient leans: normal  Psychiatric Specialty Exam: Review of Systems  Constitutional: Positive for malaise/fatigue.  HENT: Negative.   Eyes: Negative.   Respiratory: Negative.   Cardiovascular: Negative.   Gastrointestinal: Negative.   Genitourinary: Negative.   Musculoskeletal: Negative.   Skin: Negative.   Neurological: Negative.   Endo/Heme/Allergies: Negative.   Psychiatric/Behavioral: Positive for depression and substance abuse. The patient is nervous/anxious.     Blood pressure 112/64, pulse 56, temperature 97.4 F (36.3 C), temperature source Oral, resp. rate 20, height  (1.651 m), weight 74.844 kg (165 lb), SpO2 99 %, unknown if currently breastfeeding.Body mass index is 27.46 kg/(m^2).  General Appearance: Fairly Groomed  Patent attorney::  Fair  Speech:  Clear and Coherent  Volume:  Normal  Mood:  Anxious, Depressed and worried  Affect:  anxious worried  Thought Process:  Coherent and Goal Directed  Orientation:  Full (Time, Place, and Person)  Thought Content:  symptoms events worries concerns  Suicidal Thoughts:  No  Homicidal Thoughts:  No  Memory:  Immediate;   Fair Recent;   Fair Remote;   Fair  Judgement:  Fair  Insight:  Present  Psychomotor Activity:  Restlessness  Concentration:  Fair  Recall:  [employer] of Knowledge:Fair  Language: Fair  Akathisia:  No  Handed:  Right  AIMS (if indicated):     Assets:  Desire for Improvement Housing Social Support  ADL's:  Intact  Cognition: WNL  Sleep:  Number of Hours: 6.5   Treatment Plan  Summary: Daily contact with patient to assess and evaluate symptoms and progress in treatment and Medication management Supportive approach/coping skills Polysubstance dependence including opioids: continue detox protocol/work a relapse prevention plan Mood instability; continue to work with the Seroquel 50 mg TID, 200 mg HS/continue Lamictal 50 mg daily Depression; continue to work with the Zoloft 50 mg daily Work with CBT/mindfulness Khamani Fairley A 09/02/2015, 5:34 PM

## 2015-09-02 NOTE — Tx Team (Addendum)
Interdisciplinary Treatment Plan Update (Adult) Date: 09/02/2015   Time Reviewed: 9:30 AM  Progress in Treatment: Attending groups: No Participating in groups: No Taking medication as prescribed: Yes Tolerating medication: Yes Family/Significant other contact made: Yes, CSW has spoken with husband Patient understands diagnosis: Yes Discussing patient identified problems/goals with staff: Yes Medical problems stabilized or resolved: Yes Denies suicidal/homicidal ideation: Yes Issues/concerns per patient self-inventory: Yes Other:  New problem(s) identified: N/A  Discharge Plan or Barriers: Patient plans to return home to follow up with outpatient services  Reason for Continuation of Hospitalization:  Depression Anxiety Medication Stabilization   Comments: N/A  Estimated length of stay: Discharge anticipated for 09/04/15    Patient is a 30 year old female with a diagnosis of Adjustment Disorder, Opioid Use Disorder, severe, Cocaine Use Disorder, severe, Cannabis Use Disorder, severe and Alcohol Use Disorder, moderate. Pt presented to the hospital with suicidal and homicidal ideation. Pt reports primary triggers for admission was her drug use, suicidal ideation and marital issues. Patient will benefit from crisis stabilization, medication evaluation, group therapy and psycho education in addition to case management for discharge planning. At discharge, it is recommended that Pt remain compliant with established discharge plan and continued treatment.    Review of initial/current patient goals per problem list:  1. Goal(s): Patient will participate in aftercare plan   Met: Yes   Target date: 3-5 days post admission date   As evidenced by: Patient will participate within aftercare plan AEB aftercare provider and housing plan at discharge being identified.  1/30: Goal not met: CSW assessing for appropriate referrals for pt and will have follow up secured prior to d/c.  2/1:  Goal met. Patient plans to return home to follow up with outpatient services.      2. Goal (s): Patient will exhibit decreased depressive symptoms and suicidal ideations.   Met: Adequate for discharge per MD   Target date: 3-5 days post admission date   As evidenced by: Patient will utilize self rating of depression at 3 or below and demonstrate decreased signs of depression or be deemed stable for discharge by MD.  1/30: Goal not met: Pt presents with flat affect and depressed mood.  Pt admitted with depression rating of 10.  Pt to show decreased sign of depression and a rating of 3 or less before d/c.    2/1: Adequate for discharge per MD. Patient reports baseline symptoms of depression and denies SI.   3. Goal(s): Patient will demonstrate decreased signs and symptoms of anxiety.   Met: Adequate for discharge per MD   Target date: 3-5 days post admission date   As evidenced by: Patient will utilize self rating of anxiety at 3 or below and demonstrated decreased signs of anxiety, or be deemed stable for discharge by MD  1/30: Goal not met: Pt presents with anxious mood and affect.  Pt admitted with anxiety rating of 10.  Pt to show decreased sign of anxiety and a rating of 3 or less before d/c.  2/1: Adequate for discharge. Patient reports feeling safe for discharge, reports baseline anxiety.    4. Goal(s): Patient will demonstrate decreased signs of withdrawal due to substance abuse   Met: Adequate for discharge per MD   Target date: 3-5 days post admission date   As evidenced by: Patient will produce a CIWA/COWS score of 0, have stable vitals signs, and no symptoms of withdrawal  1/30: Goal not met: Pt continues to have withdrawal symptoms of anxiety, tremor,  GI upset and a COWS score of a 6 today.  Pt to show decrease withdrawal symptoms prior to d/c.  2/1: Adequate for discharge. Patient with COWS score of 1.    Attendees: Patient:    Family:    Physician: Dr.  Parke Poisson; Dr. Sabra Heck 09/02/2015 9:30 AM  Nursing:  Marcella Dubs, [address] Christa Schuyler Amor, RN 09/02/2015 9:30 AM  Clinical Social Worker: Tilden Fossa, LCSW 09/02/2015 9:30 AM  Other: Peri Maris, LCSWA; [place], LCSW  09/02/2015 9:30 AM   09/02/2015 9:30 AM   09/02/2015 9:30 AM  Other:  Andria Rhein, NP 09/02/2015 9:30 AM  Other:      Scribe for Treatment Team:  Tilden Fossa, [place]

## 2015-09-02 NOTE — BHH Suicide Risk Assessment (Signed)
[facility] INPATIENT:  Family/Significant Other Suicide Prevention Education  Suicide Prevention Education:  Education Completed; husband Avonell Lenig [phone],  (name of family member/significant other) has been identified by the patient as the family member/significant other with whom the patient will be residing, and identified as the person(s) who will aid the patient in the event of a mental health crisis (suicidal ideations/suicide attempt).  With written consent from the patient, the family member/significant other has been provided the following suicide prevention education, prior to the and/or following the discharge of the patient.  The suicide prevention education provided includes the following:  Suicide risk factors  Suicide prevention and interventions  National Suicide Hotline telephone number  [facility] assessment telephone number  [facility] Emergency Assistance 911  [facility] and/or Residential Mobile Crisis Unit telephone number  Request made of family/significant other to:  Remove weapons (e.g., guns, rifles, knives), all items previously/currently identified as safety concern.    Remove drugs/medications (over-the-counter, prescriptions, illicit drugs), all items previously/currently identified as a safety concern.  The family member/significant other verbalizes understanding of the suicide prevention education information provided.  The family member/significant other agrees to remove the items of safety concern listed above.  Alysha Doolan, [place] 09/02/2015, 3:42 PM

## 2015-09-03 DIAGNOSIS — F332 Major depressive disorder, recurrent severe without psychotic features: Secondary | ICD-10-CM | POA: Diagnosis present

## 2015-09-03 NOTE — Progress Notes (Signed)
Pt was in the bed the first part of the evening, but got out of bed later and came into the dayroom.  She reports that she is feeling a little better, and that her withdrawal symptoms are decreasing a little.  She voiced no complaints of diarrhea or nausea tonight.  She is still expressing SI, but contracts for safety.  She denies HI/AVH.  She wants to go to long term treatment after detox.  Support and encouragement offered.  Pt makes her needs known to staff.  Discharge plans are in process and referrals made.  Safety maintained with q15 minute checks.

## 2015-09-03 NOTE — Progress Notes (Signed)
Recreation Therapy Notes  Animal-Assisted Activity (AAA) Program Checklist/Progress Notes Patient Eligibility Criteria Checklist & Daily Group note for Rec Tx Intervention  Date: 01.31.2017 Time: 2:45pm Location: [address] [employer]   AAA/T Program Assumption of Risk Form signed by Patient/ or Parent Legal Guardian yes  Patient is free of allergies or sever asthma yes  Patient reports no fear of animals yes  Patient reports no history of cruelty to animals yes  Patient understands his/her participation is voluntary yes  Behavioral Response: Did not attend.   Marykay Lex Ronnesha Mester, LRT/CTRS  Mont[place]ius Subramaniam L 09/03/2015 3:21 PM

## 2015-09-03 NOTE — Progress Notes (Signed)
Adult Psychoeducational Group Note  Date:  09/03/2015 Time:  9:46 PM  Group Topic/Focus:  Wrap-Up Group:   The focus of this group is to help patients review their daily goal of treatment and discuss progress on daily workbooks.  Participation Level:  Active  Participation Quality:  Appropriate  Affect:  Appropriate  Cognitive:  Alert  Insight: Appropriate  Engagement in Group:  Engaged  Modes of Intervention:  Discussion  Additional Comments:  Patient goal for today was to stop cursing so much. Patient stated she achieved her goal. On a scale between 1-10 (1=worst, 10=best) patient rated her day as an 9 because "I slept good".   Amire Leazer L Leandrea Ackley 09/03/2015, 9:46 PM

## 2015-09-03 NOTE — Progress Notes (Addendum)
D:Pt unwilling to participate in plan of care today. Pt refusing to get out of room and be active in the milieu, refusing to attend groups. Pt does get OOB to attend meals. Pt irritable and demanding, "You're a stupid bitch." labile at times. Pt reports passive SI, but verbally contracts for safety. Pt denies plan to hurt self and reports she feels safe in the hospital. Pt denies HI. Denies AVH.   A:Special checks q 15 mins in place for safety. Medication administered per MD order (See eMAR) Encouragement and support provided.  R:Safety maintained. Complaint with medication regimen with much encouragement. Will continue to monitor.

## 2015-09-03 NOTE — [facility] Group Notes (Signed)
[facility] Group Notes:  (Nursing/MHT/Case Management/Adjunct)  Date:  09/03/2015  Time: 0930  Type of Therapy:  Nurse Education  Participation Level:  Did Not Attend    Dara Hoyer 09/03/2015, 11:03 AM

## 2015-09-03 NOTE — [facility] Group Notes (Signed)
[facility] LCSW Group Therapy 09/03/2015  1:15 PM   Type of Therapy: Group Therapy  Participation Level: Did Not Attend. Patient invited to participate but declined.   Samuella Bruin, MSW, [employer] Clinical Social Worker [facility] [phone]

## 2015-09-04 DIAGNOSIS — F332 Major depressive disorder, recurrent severe without psychotic features: Secondary | ICD-10-CM | POA: Insufficient documentation

## 2015-09-04 MED ORDER — LAMOTRIGINE 25 MG PO TABS: 50.0000 mg | ORAL_TABLET | Freq: Every day | ORAL | Status: AC

## 2015-09-04 MED ORDER — ALBUTEROL SULFATE HFA 108 (90 BASE) MCG/ACT IN AERS: 1.0000 | INHALATION_SPRAY | Freq: Four times a day (QID) | RESPIRATORY_TRACT | Status: AC | PRN

## 2015-09-04 MED ORDER — QUETIAPINE FUMARATE 200 MG PO TABS: 200.0000 mg | ORAL_TABLET | Freq: Every day | ORAL | Status: AC

## 2015-09-04 MED ORDER — CEPHALEXIN 500 MG PO CAPS: 500.0000 mg | ORAL_CAPSULE | Freq: Three times a day (TID) | ORAL | Status: AC

## 2015-09-04 MED ORDER — QUETIAPINE FUMARATE 50 MG PO TABS: 50.0000 mg | ORAL_TABLET | Freq: Three times a day (TID) | ORAL | Status: AC

## 2015-09-04 MED ORDER — NICOTINE 21 MG/24HR TD PT24: 21.0000 mg | MEDICATED_PATCH | Freq: Every day | TRANSDERMAL | Status: AC

## 2015-09-04 MED ORDER — SERTRALINE HCL 50 MG PO TABS: 50.0000 mg | ORAL_TABLET | Freq: Every day | ORAL | Status: AC

## 2015-09-04 NOTE — Progress Notes (Signed)
D:Pt in bed. Pt refusing to get OOB to take medication. Reporting she will only take medication if they are brought to her. Reports she does not want to get out of bed.  Presents with irritability, malaise. "Are you stupid, Do you have a developmental delay?" Pt reports passive SI, but denies a plan to hurt herself, also reports she feels safe talking to nursing staff.  Also denies HI. Denies AVH. Pt denies withdrawal symptoms.  A:Special checks q 15 mins in place for safety. Medication administered per MD order (see eMAR) Encouragement and support provided. COWS assessment.  R: safety maintained. Will continue to monitor.

## 2015-09-04 NOTE — Progress Notes (Signed)
  [facility] Adult Case Management Discharge Plan :  Will you be returning to the same living situation after discharge:  Yes,  patient plans to return home At discharge, do you have transportation home?: Yes,  patient provided with bus pass Do you have the ability to pay for your medications: Yes,  patient provided with prescriptions at discharge  Release of information consent forms completed and in the chart;  Patient's signature needed at discharge.  Patient to Follow up at: Follow-up Information    Follow up with [facility].   Why:  Walk-In Clinic is open 8am-5pm Monday-Friday, if you decide to follow up for medication or therapy (group or individual).   You have to wait for some time for the different parts of the assessment, so take food/drinks and a book.    Contact information:   [address], [place] [address] Phone: [phone] Fax: [phone]      Next level of care provider has access to [facility] Link:no  Safety Planning and Suicide Prevention discussed: Yes,  with patient and husband  Have you used any form of tobacco in the last 30 days? (Cigarettes, Smokeless Tobacco, Cigars, and/or Pipes): Yes  Has patient been referred to the Quitline?: Patient refused referral  Patient has been referred for addiction treatment: Yes  Haddon Fyfe, [place] 09/04/2015, 4:21 PM

## 2015-09-04 NOTE — Discharge Summary (Signed)
Physician Discharge Summary Note  Patient:  Gabrielle Baker is an 30 y.o., female MRN:  [id number] DOB:  June 06, 1986 Patient phone:  [phone] (home)  Patient address:   [address] [place] [address],  Total Time spent with patient: Greater than 30 minutes  Date of Admission:  08/31/2015 Date of Discharge: 09-04-15  Reason for Admission: Worsening symptoms of depression  Principal Problem: MDD (major depressive disorder), recurrent severe, without psychosis [facility])  Discharge Diagnoses: Patient Active Problem List   Diagnosis Date Noted  . MDD (major depressive disorder), recurrent severe, without psychosis (HCC) [F33.2] 09/03/2015  . Polysubstance (including opioids) dependence, daily use (HCC) [F19.20] 09/01/2015  . Panic attacks [F41.0] 09/01/2015  . Acute encephalopathy [G93.40] 10/18/2014  . Involuntary movements [R25.8] 10/18/2014  . Active labor [O80, Z37.9] 01/17/2014  . Non-stress test nonreactive [O28.9] 01/15/2014  . Cocaine abuse [F14.10] 02/13/2013  . Opioid abuse with opioid-induced mood disorder [facility]) [F11.14] 02/13/2013   Past Psychiatric History: Polysubstance dependence  Past Medical History:  Past Medical History  Diagnosis Date  . No pertinent past medical history   . Polysubstance abuse   . Mental disorder   . Depression   . Anxiety     Past Surgical History  Procedure Laterality Date  . No past surgeries     Family History:  Family History  Problem Relation Age of Onset  . Diabetes Maternal Grandmother   . Heart disease Neg Hx   . Hypertension Neg Hx   . Stroke Neg Hx    Family Psychiatric  History: See H&P  Social History:  History  Alcohol Use  . Yes    Comment: 1 pint of gin per week     History  Drug Use  . Yes  . Special: Marijuana, "Crack" cocaine, Cocaine    Comment: states has been a "long time"    Social History   Social History  . Marital Status: Married    Spouse Name: N/A  . Number of Children: N/A  .  Years of Education: N/A   Social History Main Topics  . Smoking status: Current Every Day Smoker -- 0.50 packs/day for 10 years    Types: Cigarettes  . Smokeless tobacco: Never Used  . Alcohol Use: Yes     Comment: 1 pint of gin per week  . Drug Use: Yes    Special: Marijuana, "Crack" cocaine, Cocaine     Comment: states has been a "long time"  . Sexual Activity: Yes    Birth Control/ Protection: None   Other Topics Concern  . None   Social History Narrative   ** Merged History Encounter **       Hospital Course: 30 Y/O female who states she has been very depressed for the last 2 weeks. In a relationship, they decided to take a brake from each other. She thinks he is cheating on her. During the last 2 years she has been using heroin, crack cocaine. Admits that she feels very overwhelmed. She has an 82 M/O baby and she wants to get better for him.   Gabrielle Baker was admitted to the unit with his UDS test results positive for opioid, Cocaine & THC. She did admit to having been using heroin & crack cocaine. Although, not actively suicidal, she was  presenting with symptoms of substance withdrawal. She was in need of opioid detox as well as mood stabilization treatments. After admission assessment/evaluation, Gabrielle Baker presenting symptoms were detected & identified. The medication regimen targeting those symptoms were  discussed & initiated. She received Clonidine detoxification treatment protocols to combat the withdrawal symptoms of opioid. She was also medicated & discharged on; Lamictal 25 mg for mood stabilization, Seroquel 50 mg & 200 mg respectively for agitation/mood control & Sertraline 50 mg for depression. Gabrielle Baker was enrolled & participated in the group counseling sessions being offered & held on this unit. She learned coping skills that should help her further to cope better & manage her depression/substance abuse issues after discharge. Part of her treatment regimen & discharge plans  are to link Gabrielle Baker with an outpatient provider to continue further substance abuse treatment/psychiatric care as noted below.  Gabrielle Baker has completed detox treatment & her mood is now stable. This is evidenced by her reports of improved mood, absence of suicidal ideations & substance withdrawal symptoms. She is currently being discharged to continue psychiatric care as noted below. She is provided with all the pertinent information needed to make this appointment without problems. Upon discharge, Gabrielle Baker adamantly denies any SIHI, AVH, delusional thoughts, paranoia or substance withdrawal symptoms. She left [facility] with all personal belongings in no apparent distress. Transportation per city bus. [facility] assisted with bus pass.  Physical Findings: AIMS: Facial and Oral Movements Muscles of Facial Expression: None, normal Lips and Perioral Area: None, normal Jaw: None, normal Tongue: None, normal,Extremity Movements Upper (arms, wrists, hands, fingers): None, normal Lower (legs, knees, ankles, toes): None, normal, Trunk Movements Neck, shoulders, hips: None, normal, Overall Severity Severity of abnormal movements (highest score from questions above): None, normal Incapacitation due to abnormal movements: None, normal Patient's awareness of abnormal movements (rate only patient's report): No Awareness, Dental Status Current problems with teeth and/or dentures?: No Does patient usually wear dentures?: No  CIWA:  CIWA-Ar Total: 3 COWS:  COWS Total Score: 1  Musculoskeletal: Strength & Muscle Tone: within normal limits Gait & Station: normal Patient leans: N/A  Psychiatric Specialty Exam: Review of Systems  Constitutional: Negative.   HENT: Negative.   Eyes: Negative.   Respiratory: Negative.   Cardiovascular: Negative.   Gastrointestinal: Negative.   Genitourinary: Negative.   Musculoskeletal: Negative.   Skin: Negative.   Neurological: Negative.   Endo/Heme/Allergies: Negative.    Psychiatric/Behavioral: Positive for depression (Stable) and substance abuse (Polysubstance abuse including opioids). Negative for suicidal ideas, hallucinations and memory loss. The patient has insomnia (Stable). The patient is not nervous/anxious.     Blood pressure 110/61, pulse 89, temperature 97.5 F (36.4 C), temperature source Oral, resp. rate 16, height 5\' 5"  (1.651 m), weight 74.844 kg (165 lb), SpO2 99 %, unknown if currently breastfeeding.Body mass index is 27.46 kg/(m^2).  See Md's SRA   Have you used any form of tobacco in the last 30 days? (Cigarettes, Smokeless Tobacco, Cigars, and/or Pipes): Yes  Has this patient used any form of tobacco in the last 30 days? (Cigarettes, Smokeless Tobacco, Cigars, and/or Pipes) Yes, Yes, A prescription for an FDA-approved tobacco cessation medication was offered at discharge and the patient refused  Metabolic Disorder Labs:  No results found for: HGBA1C, MPG No results found for: PROLACTIN No results found for: CHOL, TRIG, HDL, CHOLHDL, VLDL, LDLCALC  See Psychiatric Specialty Exam and Suicide Risk Assessment completed by Attending Physician prior to discharge.  Discharge destination:  Home  Is patient on multiple antipsychotic therapies at discharge:  No   Has Patient had three or more failed trials of antipsychotic monotherapy by history:  No  Recommended Plan for Multiple Antipsychotic Therapies: NA    Medication List  STOP taking these medications        benztropine 1 MG tablet  Commonly known as:  COGENTIN     naproxen 500 MG tablet  Commonly known as:  NAPROSYN     prenatal vitamin w/FE, FA 27-1 MG Tabs tablet     risperiDONE 2 MG tablet  Commonly known as:  RISPERDAL      TAKE these medications      Indication   albuterol 108 (90 Base) MCG/ACT inhaler  Commonly known as:  PROVENTIL HFA;VENTOLIN HFA  Inhale 1-2 puffs into the lungs every 6 (six) hours as needed for wheezing.   Indication:  Asthma      cephALEXin 500 MG capsule  Commonly known as:  KEFLEX  Take 1 capsule (500 mg total) by mouth every 8 (eight) hours. For infection   Indication:  Urinary Tract Infection, Infection     lamoTRIgine 25 MG tablet  Commonly known as:  LAMICTAL  Take 2 tablets (50 mg total) by mouth daily. For mood stabilization   Indication:  Mood stabilization     nicotine 21 mg/24hr patch  Commonly known as:  NICODERM CQ - dosed in mg/24 hours  Place 1 patch (21 mg total) onto the skin daily.   Indication:  Nicotine Addiction     QUEtiapine 200 MG tablet  Commonly known as:  SEROQUEL  Take 1 tablet (200 mg total) by mouth at bedtime. For mood control   Indication:  Mood control     QUEtiapine 50 MG tablet  Commonly known as:  SEROQUEL  Take 1 tablet (50 mg total) by mouth 3 (three) times daily. For agitation   Indication:  Agitation     sertraline 50 MG tablet  Commonly known as:  ZOLOFT  Take 1 tablet (50 mg total) by mouth daily. For depression   Indication:  Major Depressive Disorder       Follow-up Information    Follow up with [facility].   Why:  Walk-In Clinic is open 8am-5pm Monday-Friday, if you decide to follow up for medication or therapy (group or individual).   You have to wait for some time for the different parts of the assessment, so take food/drinks and a book.    Contact information:   [address], [place] [address] Phone: [phone] Fax: [phone]     Follow-up recommendations: Activity:  As tolerated Diet: As recommended by your primary care doctor. Keep all scheduled follow-up appointments as recommended.   Comments:  Take all your medications as prescribed by your mental healthcare provider. Report any adverse effects and or reactions from your medicines to your outpatient provider promptly. Patient is instructed and cautioned to not engage in alcohol and or illegal drug use while on prescription medicines. In the event of worsening symptoms, patient is  instructed to call the crisis hotline, 911 and or go to the nearest ED for appropriate evaluation and treatment of symptoms. Follow-up with your primary care provider for your other medical issues, concerns and or health care needs.   Signed: Sanjuana Kava, NP, PMHNP,  09/04/2015, 4:09 PM  I personally assessed the patient and formulated the plan Madie Reno A. Dub Mikes, M.D.

## 2015-09-04 NOTE — [facility] Suicide Risk Assessment (Signed)
[facility] Discharge Suicide Risk Assessment   Principal Problem: MDD (major depressive disorder), recurrent severe, without psychosis (HCC) Discharge Diagnoses:  Patient Active Problem List   Diagnosis Date Noted  . MDD (major depressive disorder), recurrent severe, without psychosis (HCC) [F33.2] 09/03/2015  . Polysubstance (including opioids) dependence, daily use (HCC) [F19.20] 09/01/2015  . Panic attacks [F41.0] 09/01/2015  . Acute encephalopathy [G93.40] 10/18/2014  . Involuntary movements [R25.8] 10/18/2014  . Active labor [O80, Z37.9] 01/17/2014  . Non-stress test nonreactive [O28.9] 01/15/2014  . Cocaine abuse [F14.10] 02/13/2013  . Opioid abuse with opioid-induced mood disorder (HCC) [F11.14] 02/13/2013    Total Time spent with patient: 20 minutes  Musculoskeletal: Strength & Muscle Tone: within normal limits Gait & Station: normal Patient leans: normal  Psychiatric Specialty Exam: Review of Systems  Constitutional: Negative.   HENT: Negative.   Eyes: Negative.   Respiratory: Negative.   Cardiovascular: Negative.   Gastrointestinal: Negative.   Genitourinary: Negative.   Musculoskeletal: Negative.   Skin: Negative.   Neurological: Negative.   Endo/Heme/Allergies: Negative.   Psychiatric/Behavioral: Positive for substance abuse. The patient is nervous/anxious.     Blood pressure 110/61, pulse 89, temperature 97.5 F (36.4 C), temperature source Oral, resp. rate 16, height  (1.651 m), weight 74.844 kg (165 lb), SpO2 99 %, unknown if currently breastfeeding.Body mass index is 27.46 kg/(m^2).  General Appearance: Fairly Groomed  Patent attorney::  Fair  Speech:  Clear and Coherent409  Volume:  Normal  Mood:  Euthymic  Affect:  Appropriate  Thought Process:  Coherent and Goal Directed  Orientation:  Full (Time, Place, and Person)  Thought Content:  plans as she moves on, relapse prevention plan  Suicidal Thoughts:  No  Homicidal Thoughts:  No  Memory:  Immediate  recent remote well preserved  Judgement:  Fair  Insight:  Present and Shallow  Psychomotor Activity:  Normal  Concentration:  Fair  Recall:  [employer] of Knowledge:Fair  Language: Fair  Akathisia:  No  Handed:  Right  AIMS (if indicated):     Assets:  Desire for Improvement Housing  Sleep:  Number of Hours: 6.5  Cognition: WNL  ADL's:  Intact  Ellie had a bed at [facility] for today but she declined the offer She felt she would do fine as she has been doing much better this time around. Felt this time around is different. She states she is committed to make this work.  She wants to be home with her husband and child. She wants to be D/C today as she has traffic court tomorrow morning. There are no SI plans or intent. There are no active S/S of withdrawal Mental Status Per Nursing Assessment::   On Admission:     Demographic Factors:  Caucasian  Loss Factors: Legal issues  Historical Factors: NA  Risk Reduction Factors:   Responsible for children under 26 years of age, Sense of responsibility to family, Living with another person, especially a relative and Positive social support  Continued Clinical Symptoms:  Depression:   Comorbid alcohol abuse/dependence Alcohol/Substance Abuse/Dependencies  Cognitive Features That Contribute To Risk:  None    Suicide Risk:  Minimal: No identifiable suicidal ideation.  Patients presenting with no risk factors but with morbid ruminations; may be classified as minimal risk based on the severity of the depressive symptoms  Follow-up Information    Follow up with [facility].   Why:  Walk-In Clinic is open 8am-5pm Monday-Friday, if you decide to follow up for medication or therapy (  group or individual).   You have to wait for some time for the different parts of the assessment, so take food/drinks and a book.    Contact information:   [address], [place] [address] Phone: [phone] Fax: [phone]      Plan Of Care/Follow-up  recommendations:  Activity:  as tolerated Diet:  regular Follow up as above Yanuel Tagg A, MD 09/04/2015, 4:13 PM

## 2015-09-04 NOTE — [facility] Group Notes (Signed)
[facility] LCSW Aftercare Discharge Planning Group Note  09/04/2015  8:45 AM  Participation Quality: Did Not Attend. Patient invited to participate but declined.  Priyah Schmuck, MSW, LCSW Clinical Social Worker [facility] [phone]   

## 2015-09-04 NOTE — Progress Notes (Addendum)
Discharge note:Pt discharged per MD order. Pt denies SI/HI, Denies AVH at discharge. Discharge summary reviewed with pt. Pt verbalizes understanding.  Pt eager to be discharged. "Hurry up" Pt continues to be hostile. Asking this nurse "Are you stupid?" Pt proved RX and bus pass. All personal belongings returned to pt from locker #36. Pt verbalizes and signs that she received all personal items. Pt ambulatory out of facility.

## 2015-09-04 NOTE — Progress Notes (Signed)
At the beginning of the shift, pt was in bed.  Writer went to pt's room to inform of change of shift and to inquire about pt's day.  She denies SI/HI/AVH.  She wanted to know when snack would be given.  Pt was informed that snack would be given out after group time which was a 8 pm.  Pt was also asked that before she returned to her room, to come to the med window for her evening medications.  Pt voiced understanding and was cooperative with Clinical research associate.  She says her withdrawal symptoms are minimal.  Her main complaint is back pain for which she received Ibuprofen.  She is unsure of her discharge date.  She makes her needs known to staff.  She interacts appropriately with her peers on the hall.  Support and encouragement offered.  Safety maintained with q15 minute checks.

## 2015-09-04 NOTE — [facility] Group Notes (Signed)
[facility] LCSW Group Therapy 09/04/2015  1:15 PM   Type of Therapy: Group Therapy  Participation Level: Did Not Attend. Patient invited to participate but declined.   Ramal Eckhardt, MSW, LCSW Clinical Social Worker [facility] [phone]   

## 2015-09-12 NOTE — Progress Notes (Signed)
[facility] MD Progress Note   Gabrielle Baker  MRN:  [id number] Subjective:  "I'm doing better. I just want to go home soon. I want to be clean for once."  Objective: Pt seen and chart reviewed. Pt is alert/oriented x4, calm, cooperative, and appropriate to situation. Pt denies suicidal/homicidal ideation and psychosis and does not appear to be responding to internal stimuli. Pt reports improvement in terms of anxiety and depression and reports less mood lability. Denies withdrawal sx at this time and would like to continue current regimen.   Principal Problem: MDD (major depressive disorder), recurrent severe, without psychosis (HCC) Diagnosis:   Patient Active Problem List   Diagnosis Date Noted  . MDD (major depressive disorder), recurrent severe, without psychosis (HCC) [F33.2] 09/03/2015    Priority: High  . Severe episode of recurrent major depressive disorder, without psychotic features (HCC) [F33.2]   . Polysubstance (including opioids) dependence, daily use (HCC) [F19.20] 09/01/2015  . Panic attacks [F41.0] 09/01/2015  . Acute encephalopathy [G93.40] 10/18/2014  . Involuntary movements [R25.8] 10/18/2014  . Active labor [O80, Z37.9] 01/17/2014  . Non-stress test nonreactive [O28.9] 01/15/2014  . Cocaine abuse [F14.10] 02/13/2013  . Opioid abuse with opioid-induced mood disorder [facility]) [F11.14] 02/13/2013   Total Time spent with patient: 15 minutes  Past Psychiatric History: see H&P  Past Medical History:  Past Medical History  Diagnosis Date  . No pertinent past medical history   . Polysubstance abuse   . Mental disorder   . Depression   . Anxiety     Past Surgical History  Procedure Laterality Date  . No past surgeries     Family History:  Family History  Problem Relation Age of Onset  . Diabetes Maternal Grandmother   . Heart disease Neg Hx   . Hypertension Neg Hx   . Stroke Neg Hx    Family Psychiatric  History: see admission H and P Social History:  History   Alcohol Use  . Yes    Comment: 1 pint of gin per week     History  Drug Use  . Yes  . Special: Marijuana, "Crack" cocaine, Cocaine    Comment: states has been a "long time"    Social History   Social History  . Marital Status: Married    Spouse Name: N/A  . Number of Children: N/A  . Years of Education: N/A   Social History Main Topics  . Smoking status: Current Every Day Smoker -- 0.50 packs/day for 10 years    Types: Cigarettes  . Smokeless tobacco: Never Used  . Alcohol Use: Yes     Comment: 1 pint of gin per week  . Drug Use: Yes    Special: Marijuana, "Crack" cocaine, Cocaine     Comment: states has been a "long time"  . Sexual Activity: Yes    Birth Control/ Protection: None   Other Topics Concern  . None   Social History Narrative   ** Merged History Encounter **       Additional Social History:    Pain Medications: n/a History of alcohol / drug use?: Yes Longest period of sobriety (when/how long): unknown Negative Consequences of Use: Financial, Armed forces operational officer, Personal relationships, Work / School Withdrawal Symptoms: Anorexia, Irritability Name of Substance 1: alcohol 1 - Age of First Use: 30yo 1 - Amount (size/oz): pint vodka per wk 1 - Frequency: daily 1 - Duration: as much as possible 1 - Last Use / Amount: 08/29/2015 Name of Substance 2: heroin 2 -  Age of First Use: 30yo 2 - Amount (size/oz): $100 2 - Frequency: when availalble 2 - Duration: as much as possible 2 - Last Use / Amount: 1.26.2017 Name of Substance 3: cocaine 3 - Age of First Use: 30yo 3 - Amount (size/oz): $200 3 - Last Use / Amount: 08/29/2015              Sleep: Fair yet improving  Appetite:  Fair and improving  Current Medications: No current facility-administered medications for this encounter.   Current Outpatient Prescriptions  Medication Sig Dispense Refill  . albuterol (PROVENTIL HFA;VENTOLIN HFA) 108 (90 Base) MCG/ACT inhaler Inhale 1-2 puffs into the lungs every  6 (six) hours as needed for wheezing. 1 Inhaler 0  . cephALEXin (KEFLEX) 500 MG capsule Take 1 capsule (500 mg total) by mouth every 8 (eight) hours. For infection 17 capsule 0  . lamoTRIgine (LAMICTAL) 25 MG tablet Take 2 tablets (50 mg total) by mouth daily. For mood stabilization 60 tablet 0  . nicotine (NICODERM CQ - DOSED IN MG/24 HOURS) 21 mg/24hr patch Place 1 patch (21 mg total) onto the skin daily. 28 patch 0  . QUEtiapine (SEROQUEL) 200 MG tablet Take 1 tablet (200 mg total) by mouth at bedtime. For mood control 30 tablet 0  . QUEtiapine (SEROQUEL) 50 MG tablet Take 1 tablet (50 mg total) by mouth 3 (three) times daily. For agitation 90 tablet 0  . sertraline (ZOLOFT) 50 MG tablet Take 1 tablet (50 mg total) by mouth daily. For depression 30 tablet 0    Lab Results: No results found for this or any previous visit (from the past 48 hour(s)).  Physical Findings: AIMS: Facial and Oral Movements Muscles of Facial Expression: None, normal Lips and Perioral Area: None, normal Jaw: None, normal Tongue: None, normal,Extremity Movements Upper (arms, wrists, hands, fingers): None, normal Lower (legs, knees, ankles, toes): None, normal, Trunk Movements Neck, shoulders, hips: None, normal, Overall Severity Severity of abnormal movements (highest score from questions above): None, normal Incapacitation due to abnormal movements: None, normal Patient's awareness of abnormal movements (rate only patient's report): No Awareness, Dental Status Current problems with teeth and/or dentures?: No Does patient usually wear dentures?: No  CIWA:  CIWA-Ar Total: 3 COWS:  COWS Total Score: 1  Musculoskeletal: Strength & Muscle Tone: within normal limits Gait & Station: normal Patient leans: normal  Psychiatric Specialty Exam: Review of Systems  Constitutional: Positive for malaise/fatigue.  HENT: Negative.   Eyes: Negative.   Respiratory: Negative.   Cardiovascular: Negative.    Gastrointestinal: Negative.   Genitourinary: Negative.   Musculoskeletal: Negative.   Skin: Negative.   Neurological: Negative.   Endo/Heme/Allergies: Negative.   Psychiatric/Behavioral: Positive for depression and substance abuse. The patient is nervous/anxious.     Blood pressure 110/61, pulse 89, temperature 97.5 F (36.4 C), temperature source Oral, resp. rate 16, height 5\' 5"  (1.651 m), weight 74.844 kg (165 lb), SpO2 99 %, unknown if currently breastfeeding.Body mass index is 27.46 kg/(m^2).  General Appearance: Casual and Fairly Groomed  Patent attorney::  Fair  Speech:  Clear and Coherent  Volume:  Normal  Mood:  Anxious, Depressed and worried  Affect:  anxious worried  Thought Process:  Coherent and Goal Directed  Orientation:  Full (Time, Place, and Person)  Thought Content:  symptoms events worries concerns and discharge planning  Suicidal Thoughts:  No  Homicidal Thoughts:  No  Memory:  Immediate;   Fair Recent;   Fair Remote;   Fair  Judgement:  Fair  Insight:  Present  Psychomotor Activity:  Normal  Concentration:  Fair  Recall:  [employer] of Knowledge:Fair  Language: Fair  Akathisia:  No  Handed:  Right  AIMS (if indicated):     Assets:  Desire for Improvement Housing Social Support  ADL's:  Intact  Cognition: WNL  Sleep:  Number of Hours: 6.5   Treatment Plan Summary: MDD (major depressive disorder), recurrent severe, without psychosis (HCC) improving and will continue plan unchanged as below as it is working well.  Daily contact with patient to assess and evaluate symptoms and progress in treatment and Medication management  Supportive approach/coping skills Polysubstance dependence including opioids: continue detox protocol/work a relapse prevention plan Mood instability; continue to work with the Seroquel 50 mg TID, 200 mg HS/continue Lamictal 50 mg daily Depression; continue to work with the Zoloft 50 mg daily Work with CBT/mindfulness   Beau Fanny, FNP-BC 09/03/2015   3:00PM I agree with assessment and plan Madie Reno A. Dub Mikes, M.D.

## 2016-01-29 IMAGING — RF DG FLUORO GUIDE LUMBAR PUNCTURE
2 series · 2 of 2 positions shown · non-contrast
Comparison: none

CLINICAL DATA: Altered mental status.

[Series 1: run · 1 of 1 slices shown (1 of 2)]
[im 1/1]
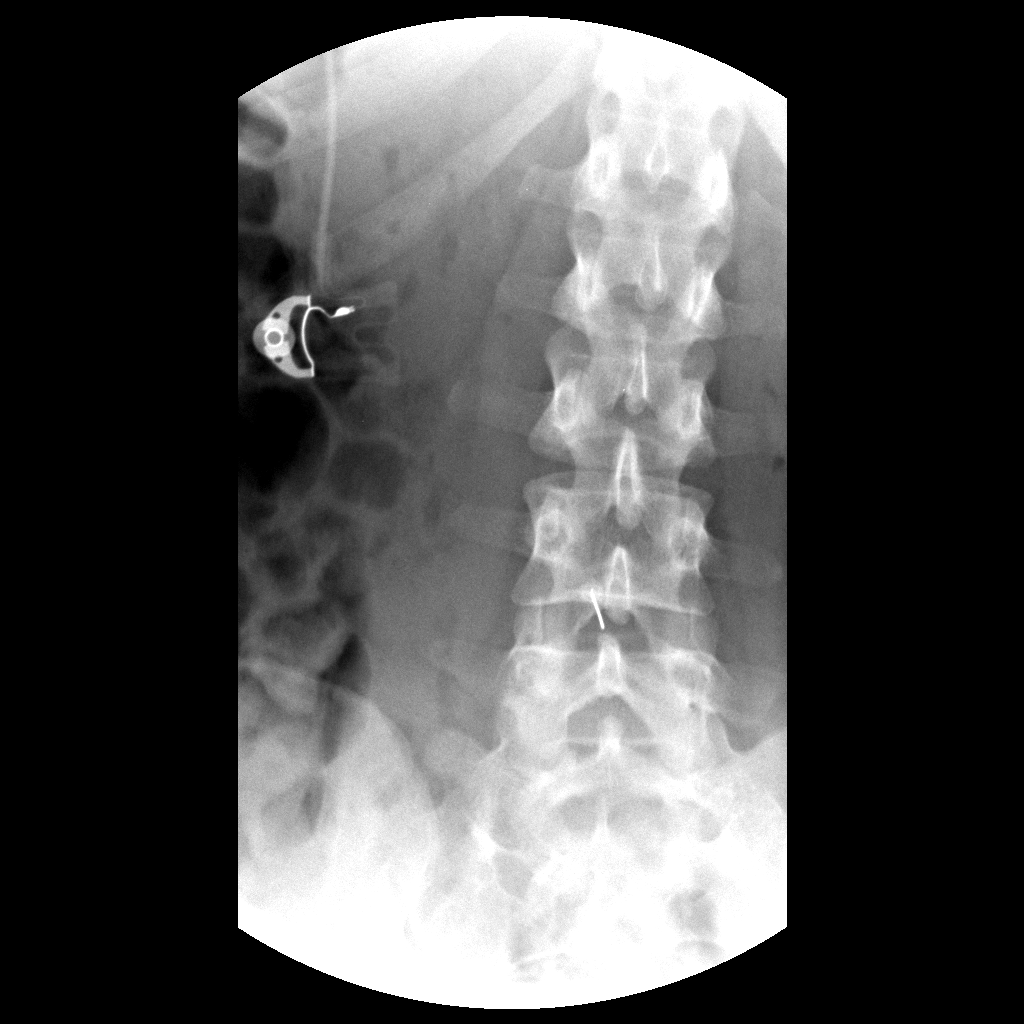

[Series 2: run · 1 of 1 slices shown (2 of 2)]
[im 1/1]
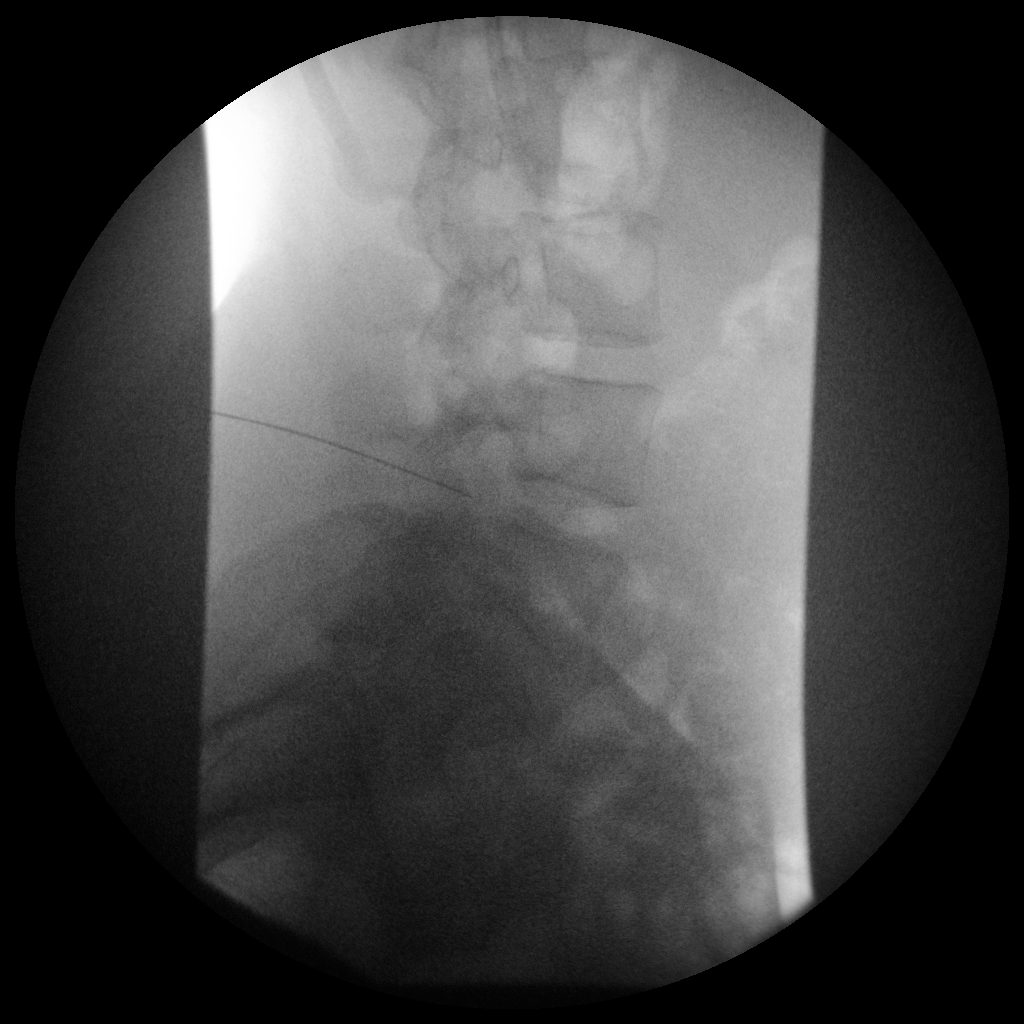

[2 of 2 positions shown; findings below may reference images not displayed]

EXAM:
DIAGNOSTIC LUMBAR PUNCTURE UNDER FLUOROSCOPIC GUIDANCE

FLUOROSCOPY TIME:  Fluoroscopy Time (in minutes and seconds): 0
minutes 23 seconds

Number of Acquired Images:  2

PROCEDURE:
Informed consent was obtained from the patient prior to the
procedure, including potential complications of headache, allergy,
and pain. With the patient prone, the lower back was prepped with
Betadine. 1% Lidocaine was used for local anesthesia. Lumbar
puncture was performed at the L4-5 level using a 21 gauge needle
with return of clear colorless CSF with an opening pressure of 17 cm
water. Twenty-six ml of CSF were obtained for laboratory studies.
The patient tolerated the procedure well and there were no apparent
complications.
IMPRESSION: Spinal tap performed at L4-5 without immediate complication.
